# Patient Record
Sex: Female | Born: 1995 | Race: Black or African American | Hispanic: No | Marital: Single | Smoking: Current some day smoker
Health system: Southern US, Community
[De-identification: ages and names within clinical notes are randomized; demographics above are authoritative.]

## PROBLEM LIST (undated history)

## (undated) ENCOUNTER — Inpatient Hospital Stay (HOSPITAL_COMMUNITY): Payer: Self-pay

## (undated) DIAGNOSIS — Z789 Other specified health status: Secondary | ICD-10-CM

## (undated) HISTORY — PX: NO PAST SURGERIES: SHX2092

---
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
## 2016-01-13 ENCOUNTER — Emergency Department (HOSPITAL_COMMUNITY): Payer: Medicaid Other

## 2016-01-13 ENCOUNTER — Emergency Department (HOSPITAL_COMMUNITY)
Admission: EM | Admit: 2016-01-13 | Discharge: 2016-01-13 | Disposition: A | Discharge status: Home / Self Care | Payer: Medicaid Other | Attending: Emergency Medicine | Admitting: Emergency Medicine

## 2016-01-13 ENCOUNTER — Encounter (HOSPITAL_COMMUNITY): Payer: Self-pay | Admitting: Emergency Medicine

## 2016-01-13 DIAGNOSIS — Y990 Civilian activity done for income or pay: Secondary | ICD-10-CM | POA: Diagnosis not present

## 2016-01-13 DIAGNOSIS — S86912A Strain of unspecified muscle(s) and tendon(s) at lower leg level, left leg, initial encounter: Secondary | ICD-10-CM | POA: Insufficient documentation

## 2016-01-13 DIAGNOSIS — S8992XA Unspecified injury of left lower leg, initial encounter: Secondary | ICD-10-CM | POA: Diagnosis present

## 2016-01-13 DIAGNOSIS — W010XXA Fall on same level from slipping, tripping and stumbling without subsequent striking against object, initial encounter: Secondary | ICD-10-CM | POA: Diagnosis not present

## 2016-01-13 DIAGNOSIS — Y9289 Other specified places as the place of occurrence of the external cause: Secondary | ICD-10-CM | POA: Diagnosis not present

## 2016-01-13 DIAGNOSIS — Y9389 Activity, other specified: Secondary | ICD-10-CM | POA: Diagnosis not present

## 2016-01-13 MED ORDER — IBUPROFEN 800 MG PO TABS: 800.0000 mg | ORAL_TABLET | Freq: Three times a day (TID) | ORAL | Status: DC

## 2016-01-13 MED ORDER — TRAMADOL HCL 50 MG PO TABS
50.0000 mg | ORAL_TABLET | Freq: Two times a day (BID) | ORAL | Status: DC | PRN
Start: 2016-01-13 — End: 2017-06-25

## 2016-01-13 NOTE — Discharge Instructions (Signed)
Knee Sprain °A knee sprain is a tear in one of the strong, fibrous tissues that connect the bones (ligaments) in your knee. The severity of the sprain depends on how much of the ligament is torn. The tear can be either partial or complete. °CAUSES  °Often, sprains are a result of a fall or injury. The force of the impact causes the fibers of your ligament to stretch too much. This excess tension causes the fibers of your ligament to tear. °SIGNS AND SYMPTOMS  °You may have some loss of motion in your knee. Other symptoms include: °· Bruising. °· Pain in the knee area. °· Tenderness of the knee to the touch. °· Swelling. °DIAGNOSIS  °To diagnose a knee sprain, your health care provider will physically examine your knee. Your health care provider may also suggest an X-ray exam of your knee to make sure no bones are broken. °TREATMENT  °If your ligament is only partially torn, treatment usually involves keeping the knee in a fixed position (immobilization) or bracing your knee for activities that require movement for several weeks. To do this, your health care provider will apply a bandage, cast, or splint to keep your knee from moving and to support your knee during movement until it heals. For a partially torn ligament, the healing process usually takes 4-6 weeks. °If your ligament is completely torn, depending on which ligament it is, you may need surgery to reconnect the ligament to the bone or reconstruct it. After surgery, a cast or splint may be applied and will need to stay on your knee for 4-6 weeks while your ligament heals. °HOME CARE INSTRUCTIONS °· Keep your injured knee elevated to decrease swelling. °· To ease pain and swelling, apply ice to the injured area: °· Put ice in a plastic bag. °· Place a towel between your skin and the bag. °· Leave the ice on for 20 minutes, 2-3 times a day. °· Only take medicine for pain as directed by your health care provider. °· Do not leave your knee unprotected until  pain and stiffness go away (usually 4-6 weeks). °· If you have a cast or splint, do not allow it to get wet. If you have been instructed not to remove it, cover it with a plastic bag when you shower or bathe. Do not swim. °· Your health care provider may suggest exercises for you to do during your recovery to prevent or limit permanent weakness and stiffness. °SEEK IMMEDIATE MEDICAL CARE IF: °· Your cast or splint becomes damaged. °· Your pain becomes worse. °· You have significant pain, swelling, or numbness below the cast or splint. °MAKE SURE YOU: °· Understand these instructions. °· Will watch your condition. °· Will get help right away if you are not doing well or get worse. °  °This information is not intended to replace advice given to you by your health care provider. Make sure you discuss any questions you have with your health care provider. °  °Document Released: 09/15/2005 Document Revised: 10/06/2014 Document Reviewed: 04/27/2013 °Elsevier Interactive Patient Education ©2016 [employer] °RICE for Routine Care of Injuries °The routine care of many injuries includes rest, ice, compression, and elevation (RICE therapy). RICE therapy is often recommended for injuries to soft tissues, such as a muscle strain, ligament injuries, bruises, and overuse injuries. It can also be used for some bony injuries. Using RICE therapy can help to relieve pain, lessen swelling, and enable your body to heal. °Rest °Rest is required to allow your body to heal. This   usually involves reducing your normal activities and avoiding use of the injured part of your body. Generally, you can return to your normal activities when you are comfortable and have been given permission by your health care provider. Ice Icing your injury helps to keep the swelling down, and it lessens pain. Do not apply ice directly to your skin.  Put ice in a plastic bag.  Place a towel between your skin and the bag.  Leave the ice on for 20  minutes, 2-3 times a day. Do this for as long as you are directed by your health care provider. Compression Compression means putting pressure on the injured area. Compression helps to keep swelling down, gives support, and helps with discomfort. Compression may be done with an elastic bandage. If an elastic bandage has been applied, follow these general tips:  Remove and reapply the bandage every 3-4 hours or as directed by your health care provider.  Make sure the bandage is not wrapped too tightly, because this can cut off circulation. If part of your body beyond the bandage becomes blue, numb, cold, swollen, or more painful, your bandage is most likely too tight. If this occurs, remove your bandage and reapply it more loosely.  See your health care provider if the bandage seems to be making your problems worse rather than better. Elevation Elevation means keeping the injured area raised. This helps to lessen swelling and decrease pain. If possible, your injured area should be elevated at or above the level of your heart or the center of your chest. WHEN SHOULD I SEEK MEDICAL CARE? You should seek medical care if:  Your pain and swelling continue.  Your symptoms are getting worse rather than improving. These symptoms may indicate that further evaluation or further X-rays are needed. Sometimes, X-rays may not show a small broken bone (fracture) until a number of days later. Make a follow-up appointment with your health care provider. WHEN SHOULD I SEEK IMMEDIATE MEDICAL CARE? You should seek immediate medical care if:  You have sudden severe pain at or below the area of your injury.  You have redness or increased swelling around your injury.  You have tingling or numbness at or below the area of your injury that does not improve after you remove the elastic bandage.   This information is not intended to replace advice given to you by your health care provider. Make sure you discuss any  questions you have with your health care provider.   Document Released: 12/28/2000 Document Revised: 06/06/2015 Document Reviewed: 08/23/2014 Elsevier Interactive Patient Education 2016 Else[employer] Inc[employer] Patellar Dislocation A patellar dislocation occurs when your kneecap (patella) slips out of its normal position in a groove in front of the lower end of your thighbone (femur). This groove is called the patellofemoral groove.  CAUSES The kneecap is normally positioned over the front of the knee joint at the base of the thighbone. A kneecap can be dislocated when:  The kneecap is out of place (patellar tracking disorder), and force is applied.  The foot is firmly planted pointing outward, and the knee bends with the thigh turned inward. This kind of injury is common during many sports activities.  The inner edge of the kneecap is hit, pushing it toward the outer side of the leg. SIGNS AND SYMPTOMS  Severe pain.  A misshapen knee that looks like a bone is out of position.  A popping sensation, followed by a feeling that something is out of place.  Inability  to bend or straighten the knee.  Knee swelling.  Cool, pale skin or numbness and tingling in or below the affected knee. DIAGNOSIS  Your health care provider will physically examine the injured area. An X-ray exam may be done to make sure a bone fracture has not occurred. In some cases, your health care provider may look inside your knee joint with an instrument much like a pencil-sized telescope (arthroscope). This may be done to make sure you have no loose cartilage in your joint. Loose cartilage is not visible on an X-ray image. TREATMENT  In many instances, the patella can be guided back into position without much difficulty. It often goes back into position by straightening the leg. Often, nothing more may be needed other than a brief period of immobilization followed by the exercises your health care provider recommends. If patellar  dislocation starts to become frequent after the first incident, surgery may be needed to prevent your patella from slipping out of place. HOME CARE INSTRUCTIONS   Only take over-the-counter or prescription medicines for pain, discomfort, or fever as directed by your health care provider.  Use a knee brace if directed to do so by your health care provider.  Use crutches as instructed.  Apply ice to the injured knee:  Put ice in a plastic bag.  Place a towel between your skin and the bag.  Leave the ice on for 20 minutes, 2-3 times a day.  Follow your health care provider's instructions for doing any recommended range-of-motion exercises or other exercises. SEEK IMMEDIATE MEDICAL CARE IF:  You have increased pain or swelling in the knee that is not relieved with medicine.  You have increasing inflammation in the knee.  You have locking or catching of your knee. MAKE SURE YOU:  Understand these instructions.  Will watch your condition.  Will get help right away if you are not doing well or get worse.   This information is not intended to replace advice given to you by your health care provider. Make sure you discuss any questions you have with your health care provider.   Document Released: 06/10/2001 Document Revised: 07/06/2013 Document Reviewed: 04/27/2013 Elsevier Interactive Patient Education [employer]

## 2016-01-13 NOTE — ED Notes (Signed)
Attempted to triage, pt ambulatory on cell phone in room. RN informed family member at bedside that I would return when patient finished phone call.

## 2016-01-13 NOTE — ED Notes (Signed)
Patient reports she was at work, and the floor was slippery. She accidentally fell on her side, and her left knee "popped out". Able to moves left lower extremity on command, rates pain 7/10.

## 2016-01-13 NOTE — ED Provider Notes (Signed)
CSN: [id number]     Arrival date & time 01/13/16  2952 History   First MD Initiated Contact with Patient 01/13/16 0732     Chief Complaint  Patient presents with  . Knee Pain     (Consider location/radiation/quality/duration/timing/severity/associated sxs/prior Treatment) HPI   Patient is an 20 year old female who presents to the emergency room complaining of left knee pain that occurred just prior to arrival when she slipped while at work and felt her knee pop. 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  She denies any knee surgery, is not established with an orthopedic surgeon.  Her knee frequently "gives out" on her.  Currently she complains of 7/10 pain at rest, worsened to 9/10 with palpation and movement. She denies swelling, redness, current deformity. She states she is able to walk but she has a limp.  The patient denies any other injury during her slipped at work, did not hit her head, did not lose consciousness, no other complaints.  Past Medical History  Diagnosis Date  . Medical history non-contributory    Past Surgical History  Procedure Laterality Date  . No past surgeries     History reviewed. No pertinent family history. Social History  Substance Use Topics  . Smoking status: Never Smoker   . Smokeless tobacco: Never Used  . Alcohol Use: No   OB History    Gravida Para Term Preterm AB TAB SAB Ectopic Multiple Living   0 0 0 0 0 0 1     Review of Systems  All other systems reviewed and are negative.     Allergies  Review of patient's allergies indicates no known allergies.  Home Medications   Prior to Admission medications   Medication Sig Start Date End Date Taking? Authorizing Provider  levonorgestrel (MIRENA) 20 MCG/24HR IUD 1 each by Intrauterine route once.   Yes Historical Provider, MD  ibuprofen (ADVIL,MOTRIN) 800 MG tablet Take 1 tablet (800 mg  total) by mouth 3 (three) times daily. 01/13/16   Danelle Berry, PA-C  traMADol (ULTRAM) 50 MG tablet Take 1 tablet (50 mg total) by mouth every 12 (twelve) hours as needed for severe pain. 01/13/16   Danelle Berry, PA-C   BP 115/69 mmHg  Pulse 76  Temp(Src) 98.5 F (36.9 C) (Oral)  Resp 19  Ht  (1.854 m)  Wt 74.844 kg  BMI 21.77 kg/m2  SpO2 100%  LMP 12/13/2015 (Exact Date) Physical Exam  Constitutional: She is oriented to person, place, and time. She appears well-developed and well-nourished. No distress.  HENT:  Head: Normocephalic and atraumatic.  Right Ear: External ear normal.  Left Ear: External ear normal.  Nose: Nose normal.  Mouth/Throat: Oropharynx is clear and moist. No oropharyngeal exudate.  Eyes: Conjunctivae and EOM are normal. Pupils are equal, round, and reactive to light. Right eye exhibits no discharge. Left eye exhibits no discharge. No scleral icterus.  Neck: Normal range of motion. Neck supple. No JVD present. No tracheal deviation present.  Cardiovascular: Normal rate and regular rhythm.   Pulses:      Dorsalis pedis pulses are 2+ on the right side, and 2+ on the left side.  Pulmonary/Chest: Effort normal and breath sounds normal. No stridor. No respiratory distress.  Musculoskeletal: Normal range of motion. She exhibits tenderness. She exhibits no edema.       Left knee: She exhibits bony tenderness. She  exhibits normal range of motion, no swelling, no effusion, no ecchymosis, no deformity, no erythema, normal alignment, no LCL laxity, normal patellar mobility, normal meniscus and no MCL laxity. Tenderness found. Medial joint line, MCL and patellar tendon tenderness noted. No lateral joint line and no LCL tenderness noted.  Lymphadenopathy:    She has no cervical adenopathy.  Neurological: She is alert and oriented to person, place, and time. No sensory deficit. She exhibits normal muscle tone. Coordination normal.  Skin: Skin is warm and dry. No rash noted.  She is not diaphoretic. No erythema. No pallor.  Psychiatric: She has a normal mood and affect. Her behavior is normal. Judgment and thought content normal.  Nursing note and vitals reviewed.   ED Course  Procedures (including critical care time) Labs Review Labs Reviewed - No data to display  Imaging Review Dg Knee Complete 4 Views Left  01/13/2016  CLINICAL DATA:  Patellar dislocation while at work 2 hours ago. Generalized left knee pain. History of patellar dislocations. EXAM: LEFT KNEE - COMPLETE 4+ VIEW COMPARISON:  05/09/2011. FINDINGS: The previously demonstrated mild irregularity of the inferior pole of the patella is not currently demonstrated. There is a small, rounded ossicle adjacent to the inferior patella, laterally. No fracture, dislocation or effusion seen. IMPRESSION: No fracture or dislocation. Electronically Signed   By: Beckie SaltsSteven  Reid M.D.   On: 01/13/2016 08:23   I have personally reviewed and evaluated these images and lab results as part of my medical decision-making.   EKG Interpretation None      MDM   Pt presents with complaint of left patellar dislocation and pain secondary to slip at work immediately PTA.  She reports hx of frequent dislocations.  Patient has been able to ambulate in the ER without any difficulty, has been observed by multiple staff members and by myself walking without a limp and on her cell phone.  On exam she has no visible abnormality, but is ttp to patella, quadriceps tendon and to medial knee.  ROM, sensation, strength, and pulses are normal.  X-rays negative for fracture dislocation. Patient was placed in a knee sleeve, encouraged follow-up with Ortho.  As rice therapy, NSAIDs.  Work note provided. Patient was discharged in good condition with stable vital signs.  Filed Vitals:   01/13/16 0700 01/13/16 0843  BP: 115/69 110/61  Pulse: 76 73  Temp: 98.5 F (36.9 C)   Resp: 19 18    Final diagnoses:  Knee strain, left, initial  encounter        Danelle BerryLeisa Rozalynn Buege, PA-C 01/13/16 [id number]  Margarita Grizzleanielle Ray, MD 01/15/16 1615

## 2016-01-13 NOTE — ED Notes (Signed)
Pt verbalized that she had all belongings at d/c.  

## 2016-11-15 ENCOUNTER — Encounter (HOSPITAL_COMMUNITY): Payer: Self-pay

## 2016-11-15 ENCOUNTER — Emergency Department (HOSPITAL_COMMUNITY)
Admission: EM | Admit: 2016-11-15 | Discharge: 2016-11-15 | Disposition: A | Discharge status: Home / Self Care | Payer: Medicaid Other | Attending: Emergency Medicine | Admitting: Emergency Medicine

## 2016-11-15 DIAGNOSIS — Y999 Unspecified external cause status: Secondary | ICD-10-CM | POA: Diagnosis not present

## 2016-11-15 DIAGNOSIS — F17200 Nicotine dependence, unspecified, uncomplicated: Secondary | ICD-10-CM | POA: Insufficient documentation

## 2016-11-15 DIAGNOSIS — W458XXA Other foreign body or object entering through skin, initial encounter: Secondary | ICD-10-CM | POA: Diagnosis not present

## 2016-11-15 DIAGNOSIS — Y9389 Activity, other specified: Secondary | ICD-10-CM | POA: Insufficient documentation

## 2016-11-15 DIAGNOSIS — S60852A Superficial foreign body of left wrist, initial encounter: Secondary | ICD-10-CM | POA: Insufficient documentation

## 2016-11-15 DIAGNOSIS — Y9289 Other specified places as the place of occurrence of the external cause: Secondary | ICD-10-CM | POA: Insufficient documentation

## 2016-11-15 DIAGNOSIS — W278XXA Contact with other nonpowered hand tool, initial encounter: Secondary | ICD-10-CM

## 2016-11-15 MED ORDER — LIDOCAINE HCL (PF) 1 % IJ SOLN
5.0000 mL | Freq: Once | INTRAMUSCULAR | Status: AC
  Administered 2016-11-15: 5 mL
  Filled 2016-11-15: qty 5

## 2016-11-15 MED ORDER — BACITRACIN ZINC 500 UNIT/GM EX OINT
TOPICAL_OINTMENT | Freq: Two times a day (BID) | CUTANEOUS | Status: DC
  Administered 2016-11-15: 1 via TOPICAL

## 2016-11-15 NOTE — ED Provider Notes (Signed)
[facility]-EMER[facility]ENCY DEPT Provider Note   CSN: [id number] Arrival date & time: 11/15/16  1546  By signing my name below, I, [name], attest that this documentation has been prepared under the direction and in the presence of Kerrie BuffaloHope Viraj Liby, NP. Electronically Signed: [name], Scribe. 11/15/16. 4:57 PM.   History   Chief Complaint Chief Complaint  Patient presents with  . Arm Pain    HPI Jennifer Prince is a 21 y.o. female who presents to the Emergency Department complaining of acute onset, mild, intermittent left arm pain s/p getting wrist piercing stuck in hairnet at working earlier today. Piercing was placed in wrist 1 month ago. Pt has no associated symptoms. Pt does not know when last tetanus shot was.   The history is provided by the patient. No language interpreter was used.    Past Medical History:  Diagnosis Date  . Medical history non-contributory     Patient Active Problem List   Diagnosis Date Noted  . Vaginal delivery 11/18/2013  . Teen pregnancy 11/18/2013    Past Surgical History:  Procedure Laterality Date  . NO PAST SURGERIES      OB History    Gravida Para Term Preterm AB Living   1 1 1  0 0 1   SAB TAB Ectopic Multiple Live Births   0 0 0 0 1       Home Medications    Prior to Admission medications   Medication Sig Start Date End Date Taking? Authorizing Provider  ibuprofen (ADVIL,MOTRIN) 800 MG tablet Take 1 tablet (800 mg total) by mouth 3 (three) times daily. 01/13/16   Danelle BerryLeisa Tapia, PA-C  levonorgestrel (MIRENA) 20 MCG/24HR IUD 1 each by Intrauterine route once.    Historical Provider, MD  traMADol (ULTRAM) 50 MG tablet Take 1 tablet (50 mg total) by mouth every 12 (twelve) hours as needed for severe pain. 01/13/16   Danelle BerryLeisa Tapia, PA-C    Family History No family history on file.  Social History Social History  Substance Use Topics  . Smoking status: Current Some Day Smoker  . Smokeless tobacco: Current User  . Alcohol use No     Allergies    Patient has no known allergies.   Review of Systems Review of Systems  Constitutional: Negative for fever.  Skin: Positive for wound. Negative for color change.     Physical Exam Updated Vital Signs BP 116/74 (BP Location: Left Arm)   Pulse 93   Temp 98.9 F (37.2 C) (Oral)   Resp 16   Ht 6\' 1"  (1.854 m)   Wt 75.8 kg   LMP 11/13/2016   SpO2 99%   BMI 22.03 kg/m   Physical Exam  Constitutional: She is oriented to person, place, and time. She appears well-developed and well-nourished.  HENT:  Head: Normocephalic and atraumatic.  Cardiovascular: Normal rate.   Pulmonary/Chest: Effort normal.  Musculoskeletal:  Piercing to the dorsum of the left wrist. Due to injury slight tear in skin at site of piercing. Piercing was partially removed. No drainage, redness, streaking at site of piercing.  Neurological: She is alert and oriented to person, place, and time.  Skin: Skin is warm and dry.  Psychiatric: She has a normal mood and affect.  Nursing note and vitals reviewed.    ED Treatments / Results   DIAGNOSTIC STUDIES: Oxygen Saturation is 99% on room air, normal by my interpretation.    COORDINATION OF CARE: 4:28 PM Discussed treatment plan with pt at bedside and pt agreed  to plan, which includes removing the piercing from left wrist.  Radiology No results found.  Procedures .Foreign Body Removal Date/Time: 11/15/2016 4:50 PM Performed by: Janne Napoleon Authorized by: Arby Barrette  Consent: Verbal consent obtained. Risks and benefits: risks, benefits and alternatives were discussed Consent given by: patient Patient understanding: patient states understanding of the procedure being performed Patient consent: the patient's understanding of the procedure matches consent given Procedure consent: procedure consent matches procedure scheduled Site marked: the operative site was marked Body area: skin General location: upper extremity Location details: left  wrist Anesthesia: local infiltration  Anesthesia: Local Anesthetic: lidocaine 1% without epinephrine Anesthetic total (ml): 1 cc.  Sedation: Patient sedated: no Patient restrained: no Patient cooperative: yes Complexity: simple 1 objects recovered. Objects recovered: piercing Post-procedure assessment: foreign body removed Patient tolerance: Patient tolerated the procedure well with no immediate complications Comments: With curved hemostat able to remove piercing without difficulty. Irrigating with normal saline 10cc and again with 10cc. Location of piercing does not appear to be infected. Plan is for pt to [place] antibiotic ointment and to keep clean an covered.   (including critical care time)    Medications Ordered in ED Medications  bacitracin ointment (not administered)  lidocaine (PF) (XYLOCAINE) 1 % injection 5 mL (5 mLs Infiltration Given 11/15/16 1638)     Initial Impression / Assessment and Plan / ED Course  I have reviewed the triage vital signs and the nursing notes.  Final Clinical Impressions(s) / ED Diagnoses   Final diagnoses:  Body piercing as cause of accidental injury    New Prescriptions New Prescriptions   No medications on file   I personally performed the services described in this documentation, which was scribed in my presence. The recorded information has been reviewed and is accurate.     [address] [place], NP 11/15/16 1706    Arby Barrette, MD 11/19/16 [phone]

## 2016-11-15 NOTE — ED Triage Notes (Signed)
PT. STATED, I HAVE THIS PIERCING IN MY LEFT WRIST AREA. I had it put there a month ago.  Its just a piercing, and I need it taken out.

## 2016-11-15 NOTE — ED Notes (Signed)
Declined W/C at D/C and was escorted to lobby by RN. 

## 2016-12-21 ENCOUNTER — Emergency Department (HOSPITAL_COMMUNITY)
Admission: EM | Admit: 2016-12-21 | Discharge: 2016-12-21 | Disposition: A | Discharge status: Home / Self Care | Payer: Medicaid Other | Attending: Emergency Medicine | Admitting: Emergency Medicine

## 2016-12-21 ENCOUNTER — Encounter (HOSPITAL_COMMUNITY): Payer: Self-pay

## 2016-12-21 DIAGNOSIS — Z79899 Other long term (current) drug therapy: Secondary | ICD-10-CM | POA: Diagnosis not present

## 2016-12-21 DIAGNOSIS — X500XXA Overexertion from strenuous movement or load, initial encounter: Secondary | ICD-10-CM | POA: Insufficient documentation

## 2016-12-21 DIAGNOSIS — Y990 Civilian activity done for income or pay: Secondary | ICD-10-CM | POA: Insufficient documentation

## 2016-12-21 DIAGNOSIS — Y929 Unspecified place or not applicable: Secondary | ICD-10-CM | POA: Diagnosis not present

## 2016-12-21 DIAGNOSIS — M25512 Pain in left shoulder: Secondary | ICD-10-CM | POA: Diagnosis present

## 2016-12-21 DIAGNOSIS — Y939 Activity, unspecified: Secondary | ICD-10-CM | POA: Insufficient documentation

## 2016-12-21 DIAGNOSIS — F17200 Nicotine dependence, unspecified, uncomplicated: Secondary | ICD-10-CM | POA: Insufficient documentation

## 2016-12-21 MED ORDER — IBUPROFEN 600 MG PO TABS: 600.0000 mg | ORAL_TABLET | Freq: Four times a day (QID) | ORAL | 0 refills | Status: DC | PRN

## 2016-12-21 NOTE — ED Notes (Signed)
See edp assessment 

## 2016-12-21 NOTE — ED Provider Notes (Signed)
[facility] Provider Note   CSN: [id number] Arrival date & time: 12/21/16  1913   By signing my name below, I, Soijett Blue, attest that this documentation has been prepared under the direction and in the presence of Audry Pili, PA-C Electronically Signed: Soijett Blue, ED Scribe. 12/21/16. 7:32 PM.  History   Chief Complaint Chief Complaint  Patient presents with  . Back Pain    HPI Jennifer Prince is a 21 y.o. female who presents to the Emergency Department complaining of left shoulder pain onset this morning. Pt has tried ibuprofen and aleve with no relief of her symptoms. She states that she woke up with a stiff shoulder that is worsened with movement.She notes that she works at [employer] and completes heavy lifting while working. Pt denies back pain, color change, wound, recent trauma, recent injury, and any other symptoms.   The history is provided by the patient. No language interpreter was used.    Past Medical History:  Diagnosis Date  . Medical history non-contributory     Patient Active Problem List   Diagnosis Date Noted  . Vaginal delivery 11/18/2013  . Teen pregnancy 11/18/2013    Past Surgical History:  Procedure Laterality Date  . NO PAST SURGERIES      OB History    Gravida Para Term Preterm AB Living   1 1 1  0 0 1   SAB TAB Ectopic Multiple Live Births   0 0 0 0 1       Home Medications    Prior to Admission medications   Medication Sig Start Date End Date Taking? Authorizing Provider  ibuprofen (ADVIL,MOTRIN) 800 MG tablet Take 1 tablet (800 mg total) by mouth 3 (three) times daily. 01/13/16   Danelle Berry, PA-C  levonorgestrel (MIRENA) 20 MCG/24HR IUD 1 each by Intrauterine route once.    Historical Provider, MD  traMADol (ULTRAM) 50 MG tablet Take 1 tablet (50 mg total) by mouth every 12 (twelve) hours as needed for severe pain. 01/13/16   Danelle Berry, PA-C    Family History History reviewed. No pertinent family history.  Social  History Social History  Substance Use Topics  . Smoking status: Current Some Day Smoker  . Smokeless tobacco: Current User  . Alcohol use No     Allergies   Patient has no known allergies.   Review of Systems Review of Systems  Musculoskeletal: Positive for arthralgias (left shoulder). Negative for back pain.  Skin: Negative for color change and wound.     Physical Exam Updated Vital Signs BP 111/74 (BP Location: Left Arm)   Pulse 98   Temp 98.7 F (37.1 C) (Oral)   Resp 18   LMP 12/20/2016 (Exact Date)   SpO2 100%   Physical Exam  Constitutional: She is oriented to person, place, and time. She appears well-developed and well-nourished. No distress.  HENT:  Head: Normocephalic and atraumatic.  Eyes: EOM are normal.  Neck: Neck supple.  Cardiovascular: Normal rate.   Pulmonary/Chest: Effort normal. No respiratory distress.  Abdominal: She exhibits no distension.  Musculoskeletal: Normal range of motion.  Negative hawkins test, negative Neer's test. TTP anterior left shoulder. No pain with flexion/extension/abduction/adduction internal or external rotation. No obvious bony deformity.  Neurological: She is alert and oriented to person, place, and time.  Skin: Skin is warm and dry.  Psychiatric: She has a normal mood and affect. Her behavior is normal.  Nursing note and vitals reviewed.    ED Treatments / Results  DIAGNOSTIC  STUDIES: Oxygen Saturation is 100% on RA, nl by my interpretation.    COORDINATION OF CARE: 7:52 PM Discussed treatment plan with pt at bedside which includes UA and pt agreed to plan.   Labs (all labs ordered are listed, but only abnormal results are displayed) Labs Reviewed  POC URINE PREG, ED     Procedures Procedures (including critical care time)  Medications Ordered in ED Medications - No data to display   Initial Impression / Assessment and Plan / ED Course  I have reviewed the triage vital signs and the nursing  notes.  Pertinent labs & imaging results that were available during my care of the patient were reviewed by me and considered in my medical decision making (see chart for details).  I have reviewed and evaluated the relevant laboratory values. I have reviewed the relevant previous healthcare records. I obtained HPI from historian.  ED Course:  Assessment: Pt is a 21 y.o. female who presents with left shoulder pain. Occurred this AM. Worse with movement. Heavy lifting at work last night. No numbness/tingling. No back pain. On exam, pt in NAD. Nontoxic/nonseptic appearing. VSS. Afebrile. Likely rotator cuff tendonitis. Given sling. Plan is to DC home with follow up to PCP. At time of discharge, Patient is in no acute distress. Vital Signs are stable. Patient is able to ambulate. Patient able to tolerate PO.   Disposition/Plan:  DC home Additional Verbal discharge instructions given and discussed with patient.  Pt Instructed to f/u with PCP in the next week for evaluation and treatment of symptoms. Return precautions given Pt acknowledges and agrees with plan  Supervising Physician Shaune Pollackameron Isaacs, MD  Final Clinical Impressions(s) / ED Diagnoses   Final diagnoses:  Acute pain of left shoulder    New Prescriptions New Prescriptions   No medications on file   I personally performed the services described in this documentation, which was scribed in my presence. The recorded information has been reviewed and is accurate.    Audry Piliyler Rhyland Hinderliter, PA-C 12/21/16 1956    Shaune Pollackameron Isaacs, MD 12/22/16 2049

## 2016-12-21 NOTE — Discharge Instructions (Signed)
Please read and follow all provided instructions.  Your diagnoses today include:  1. Acute pain of left shoulder     Tests performed today include: Vital signs. See below for your results today.   Medications prescribed:  Take as prescribed   Home care instructions:  Follow any educational materials contained in this packet.  Follow-up instructions: Please follow-up with your primary care provider for further evaluation of symptoms and treatment   Return instructions:  Please return to the Emergency Department if you do not get better, if you get worse, or new symptoms OR  - Fever (temperature greater than 101.55F)  - Bleeding that does not stop with holding pressure to the area    -Severe pain (please note that you may be more sore the day after your accident)  - Chest Pain  - Difficulty breathing  - Severe nausea or vomiting  - Inability to tolerate food and liquids  - Passing out  - Skin becoming red around your wounds  - Change in mental status (confusion or lethargy)  - New numbness or weakness    Please return if you have any other emergent concerns.  Additional Information:  Your vital signs today were: BP 111/74 (BP Location: Left Arm)    Pulse 98    Temp 98.7 F (37.1 C) (Oral)    Resp 18    LMP 12/20/2016 (Exact Date)    SpO2 100%  If your blood pressure (BP) was elevated above 135/85 this visit, please have this repeated by your doctor within one month. ---------------

## 2016-12-21 NOTE — ED Triage Notes (Signed)
Pt complaining of L shoulder and back pain. Pt denies any injury/trauma.  Pt denies states believes related to recent work.

## 2017-03-27 DIAGNOSIS — R070 Pain in throat: Secondary | ICD-10-CM | POA: Diagnosis present

## 2017-03-27 DIAGNOSIS — Z79899 Other long term (current) drug therapy: Secondary | ICD-10-CM | POA: Diagnosis not present

## 2017-03-27 DIAGNOSIS — R6883 Chills (without fever): Secondary | ICD-10-CM | POA: Insufficient documentation

## 2017-03-27 DIAGNOSIS — R110 Nausea: Secondary | ICD-10-CM | POA: Insufficient documentation

## 2017-03-27 DIAGNOSIS — R05 Cough: Secondary | ICD-10-CM | POA: Diagnosis not present

## 2017-03-27 DIAGNOSIS — F17200 Nicotine dependence, unspecified, uncomplicated: Secondary | ICD-10-CM | POA: Diagnosis not present

## 2017-03-27 DIAGNOSIS — Z5321 Procedure and treatment not carried out due to patient leaving prior to being seen by health care provider: Secondary | ICD-10-CM | POA: Diagnosis not present

## 2017-03-28 ENCOUNTER — Emergency Department (HOSPITAL_COMMUNITY): Payer: Medicaid Other

## 2017-03-28 ENCOUNTER — Encounter (HOSPITAL_COMMUNITY): Payer: Self-pay

## 2017-03-28 ENCOUNTER — Emergency Department (HOSPITAL_COMMUNITY)
Admission: EM | Admit: 2017-03-28 | Discharge: 2017-03-28 | Disposition: A | Discharge status: Home / Self Care | Payer: Medicaid Other | Attending: Emergency Medicine | Admitting: Emergency Medicine

## 2017-03-28 LAB — RAPID STREP SCREEN (MED CTR MEBANE ONLY): Streptococcus, Group A Screen (Direct): NEGATIVE

## 2017-03-28 NOTE — ED Notes (Signed)
Pt c/o throat pain, chills, body aches, coughing, some nausea.

## 2017-03-30 LAB — CULTURE, GROUP A STREP (THRC)

## 2017-05-27 ENCOUNTER — Encounter (HOSPITAL_COMMUNITY): Payer: Self-pay | Admitting: *Deleted

## 2017-05-27 ENCOUNTER — Inpatient Hospital Stay (HOSPITAL_COMMUNITY)
Admission: AD | Admit: 2017-05-27 | Discharge: 2017-05-27 | Discharge status: Against Medical Advice | Payer: Medicaid Other | Source: Ambulatory Visit | Attending: Family Medicine | Admitting: Family Medicine

## 2017-05-27 DIAGNOSIS — N941 Unspecified dyspareunia: Secondary | ICD-10-CM | POA: Insufficient documentation

## 2017-05-27 DIAGNOSIS — N939 Abnormal uterine and vaginal bleeding, unspecified: Secondary | ICD-10-CM | POA: Insufficient documentation

## 2017-05-27 DIAGNOSIS — Z30432 Encounter for removal of intrauterine contraceptive device: Secondary | ICD-10-CM | POA: Insufficient documentation

## 2017-05-27 LAB — URINALYSIS, ROUTINE W REFLEX MICROSCOPIC
Bilirubin Urine: NEGATIVE
Glucose, UA: NEGATIVE mg/dL
KETONES UR: 5 mg/dL — AB
Nitrite: NEGATIVE
Protein, ur: 30 mg/dL — AB
Specific Gravity, Urine: 1.024 (ref 1.005–1.030)
pH: 6 (ref 5.0–8.0)

## 2017-05-27 LAB — WET PREP, GENITAL
Trich, Wet Prep: NONE SEEN
Yeast Wet Prep HPF POC: NONE SEEN

## 2017-05-27 LAB — POCT PREGNANCY, URINE: PREG TEST UR: NEGATIVE

## 2017-05-27 NOTE — MAU Provider Note (Signed)
HPI:   [name] is a 21 y.o. female presenting with the complaints of vaginal bleeding and cramping due to her IUD. Pt states that it hurts her when she lays supine, during urination, and during intercourse with penetration. Pt had the IUD place at [employer]. Symptoms have been for a couple weeks with the bleeding happening for a week.  Marland Kitchen.Review of Systems  Constitutional: Negative for chills, fever and malaise/fatigue.  Gastrointestinal: Positive for abdominal pain. Negative for constipation, diarrhea, nausea and vomiting.  Genitourinary: Positive for dysuria, frequency and hematuria. Negative for urgency.       Pt states she does not discharge in her clothing. Just notices blood while urinating    .Marland KitchenPhysical Exam  Constitutional: She is oriented to person, place, and time and well-developed, well-nourished, and in no distress. Vital signs are normal.  HENT:  Head: Normocephalic and atraumatic.  Cardiovascular: Normal rate, regular rhythm, S1 normal and S2 normal.   Pulmonary/Chest: Effort normal and breath sounds normal. No accessory muscle usage. No respiratory distress.  Genitourinary:  Genitourinary Comments: Vagina: small amount of white mucoid discharge. No odor.  Cervix: Non-reddened. IUD noticed mal positioned on exam. Removed using ring forceps without difficulty. Patient tolerated procedure well. Chaperone: Primary RN charperoned pelvic exam   Neurological: She is alert and oriented to person, place, and time.  Skin: Skin is warm, dry and intact.   Patient Vitals for the past 24 hrs:  BP Temp Temp src Pulse Resp Height Weight  05/27/17 1634 112/65 97.7 F (36.5 C) Oral 91 16 6\' 3"  (1.905 m) 144 lb (65.3 kg)   Results for orders placed or performed during the hospital encounter of 05/27/17 (from the past 48 hour(s))  Urinalysis, Routine w reflex microscopic     Status: Abnormal   Collection Time: 05/27/17  4:38 PM  Result Value Ref Range    Color, Urine YELLOW YELLOW   APPearance HAZY (A) CLEAR   Specific Gravity, Urine 1.024 1.005 - 1.030   pH 6.0 5.0 - 8.0   Glucose, UA NEGATIVE NEGATIVE mg/dL   Hgb urine dipstick SMALL (A) NEGATIVE   Bilirubin Urine NEGATIVE NEGATIVE   Ketones, ur 5 (A) NEGATIVE mg/dL   Protein, ur 30 (A) NEGATIVE mg/dL   Nitrite NEGATIVE NEGATIVE   Leukocytes, UA MODERATE (A) NEGATIVE   RBC / HPF 6-30 0 - 5 RBC/hpf   WBC, UA TOO NUMEROUS TO COUNT 0 - 5 WBC/hpf   Bacteria, UA RARE (A) NONE SEEN   Squamous Epithelial / LPF 0-5 (A) NONE SEEN   Mucus PRESENT    Non Squamous Epithelial 0-5 (A) NONE SEEN  Pregnancy, urine POC     Status: None   Collection Time: 05/27/17  5:08 PM  Result Value Ref Range   Preg Test, Ur NEGATIVE NEGATIVE    Comment:        THE SENSITIVITY OF THIS METHODOLOGY IS >24 mIU/mL   Wet prep, genital     Status: Abnormal   Collection Time: 05/27/17  5:30 PM  Result Value Ref Range   Yeast Wet Prep HPF POC NONE SEEN NONE SEEN   Trich, Wet Prep NONE SEEN NONE SEEN   Clue Cells Wet Prep HPF POC PRESENT (A) NONE SEEN   WBC, Wet Prep HPF POC MODERATE (A) NONE SEEN    Comment: MODERATE BACTERIA SEEN   Sperm PRESENT        A:  1. Encounter for IUD removal   2. Dyspareunia, female  P:  Patient left prior to results discussed or paper work given.   Prince confirm that Prince have verified the information documented in the nurse practitioner's Prince note and that Prince have also personally reperformed the physical exam and all medical decision making activities.  Jennifer Prince    Jennifer Prince, Jennifer Rundquist I, NP 05/27/2017 7:38 PM

## 2017-05-27 NOTE — MAU Note (Signed)
Pt has had IUD for past 3 years, is painful during urination & intercourse, has cramps all the time.   Has spotting @ times for the last 3 weeks.  Was inserted by [facility].

## 2017-05-27 NOTE — Discharge Instructions (Signed)
Dyspareunia, Female Dyspareunia is pain that is associated with sexual activity. This can affect any part of the genitals or lower abdomen, and there are many possible causes. This condition ranges from mild to severe. Depending on the cause, dyspareunia may get better with treatment, or it may return (recur) over time. What are the causes? The cause of this condition is not always known. Possible causes include:  Cancer.  Psychological factors, such as depression, anxiety, or previous traumatic experiences.  Severe pain and tenderness of the skin around the vagina (vulva) when it is touched (vulvar vestibulitis syndrome).  Infection of the pelvis or the vulva.  Infection of the vagina.  Painful, involuntary tightening (contraction) of the vaginal muscles when anything is put inside the vagina (vaginismus).  Allergic reaction.  Ovarian cysts.  Solid growths of tissue (tumors) in the ovaries or the uterus.  Scar tissue in the ovaries, vagina, or pelvis.  Vaginal dryness.  Thinning of the tissue (atrophy) of the vulva and vagina.  Skin conditions that affect the vulva (vulvar dermatoses), such as lichen sclerosus or lichen planus.  Endometriosis.  Tubal pregnancy.  A tilted uterus.  Uterine prolapse.  Adhesions in the vagina.  Bladder problems.  Intestinal problems.  Certain medicines.  Medical conditions such as diabetes, arthritis, or thyroid disease.  What increases the risk? The following factors may make you more likely to develop this condition:  Having experienced physical or sexual trauma.  Having given birth more than once.  Taking birth control pills.  Having gone through menopause.  Having recently given birth, typically within the past 3-6 months.  Breastfeeding.  What are the signs or symptoms? The main symptom of this condition is pain in any part of the genitals or lower abdomen during or after sexual activity. This may include pain  during sexual arousal, genital stimulation, or orgasm. Pain may get worse when anything is inserted into the vagina, or when the genitals are touched in any way, such as when sitting or wearing pants. Pain can range from mild to severe, depending on the cause of the condition. In some cases, symptoms go away with treatment and return (recur) at a later date. How is this diagnosed? This condition may be diagnosed based on:  Your symptoms, including: ? Where your pain is located. ? When your pain occurs.  Your medical history.  A physical exam. This may include a pelvic exam and a Pap test. This is a screening test that is used to check for signs of cancer of the vagina, cervix, and uterus.  Tests, including: ? Blood tests. ? Ultrasound. This uses sound waves to make a picture of the area that is being tested. ? Urine culture. This test involves checking a urine sample for signs of infection. ? Culture test. This is when your health care provider uses a swab to collect a sample of vaginal fluid. The sample is checked for signs of infection. ? X-rays. ? MRI. ? CT scan. ? Laparoscopy. This is a procedure in which a small incision is made in your lower abdomen and a lighted, pencil-sized instrument (laparoscope) is passed through the incision and used to look inside your pelvis.  You may be referred to a health care provider who specializes in women's health (gynecologist). In some cases, diagnosing the cause of dyspareunia can be difficult. How is this treated? Treatment depends on the cause of your condition and your symptoms. In most cases, you may need to stop sexual activity until your symptoms  improve. Treatment may include:  Lubricants.  Kegel exercises or vaginal dilators.  Medicated skin creams.  Medicated vaginal creams.  Hormonal therapy.  Antibiotic medicine to prevent or fight infection.  Medicines that help to relieve pain.  Medicines that treat depression  (antidepressants).  Psychological counseling.  Sex therapy.  Surgery.  Follow these instructions at home: Lifestyle  Avoid tight clothing and irritating materials around your genital and abdominal area.  Use water-based lubricants as needed. Avoid oil-based lubricants.  Do not use any products that irritate you. This may include certain condoms, spermicides, lubricants, soaps, tampons, vaginal sprays, or douches.  Always practice safe sex. Talk with your health care provider about which form of birth control (contraception) is best for you.  Maintain open communication with your sexual partner. General instructions  Take over-the-counter and prescription medicines only as told by your health care provider.  If you had tests done, it is your responsibility to get your tests results. Ask your health care provider or the department performing the test when your results will be ready.  Urinate before you engage in sexual activity.  Consider joining a support group.  Keep all follow-up visits as told by your health care provider. This is important. Contact a health care provider if:  You develop vaginal bleeding after sexual intercourse.  You develop a lump at the opening of your vagina. Seek medical care even if the lump is painless.  You have: ? Abnormal vaginal discharge. ? Vaginal dryness. ? Itchiness or irritation of your vulva or vagina. ? A new rash. ? Symptoms that get worse or do not improve with treatment. ? A fever. ? Pain when you urinate. ? Blood in your urine. Get help right away if:  You develop severe pain in your abdomen during or shortly after sexual intercourse.  You pass out after having sexual intercourse. This information is not intended to replace advice given to you by your health care provider. Make sure you discuss any questions you have with your health care provider. Document Released: 10/05/2007 Document Revised: 01/25/2016 Document  Reviewed: 04/17/2015 Elsevier Interactive Patient Education  2018 [employer].   Intrauterine Device Information An intrauterine device (IUD) is inserted into your uterus to prevent pregnancy. There are two types of IUDs available:  Copper IUD--This type of IUD is wrapped in copper wire and is placed inside the uterus. Copper makes the uterus and fallopian tubes produce a fluid that kills sperm. The copper IUD can stay in place for 10 years.  Hormone IUD--This type of IUD contains the hormone progestin (synthetic progesterone). The hormone thickens the cervical mucus and prevents sperm from entering the uterus. It also thins the uterine lining to prevent implantation of a fertilized egg. The hormone can weaken or kill the sperm that get into the uterus. One type of hormone IUD can stay in place for 5 years, and another type can stay in place for 3 years.  Your health care provider will make sure you are a good candidate for a contraceptive IUD. Discuss with your health care provider the possible side effects. Advantages of an intrauterine device  IUDs are highly effective, reversible, long acting, and low maintenance.  There are no estrogen-related side effects.  An IUD can be used when breastfeeding.  IUDs are not associated with weight gain.  The copper IUD works immediately after insertion.  The hormone IUD works right away if inserted within 7 days of your period starting. You will need to use a backup  method of birth control for 7 days if the hormone IUD is inserted at any other time in your cycle.  The copper IUD does not interfere with your female hormones.  The hormone IUD can make heavy menstrual periods lighter and decrease cramping.  The hormone IUD can be used for 3 or 5 years.  The copper IUD can be used for 10 years. Disadvantages of an intrauterine device  The hormone IUD can be associated with irregular bleeding patterns.  The copper IUD can make your menstrual  flow heavier and more painful.  You may experience cramping and vaginal bleeding after insertion. This information is not intended to replace advice given to you by your health care provider. Make sure you discuss any questions you have with your health care provider. Document Released: 08/19/2004 Document Revised: 02/21/2016 Document Reviewed: 03/06/2013 Elsevier Interactive Patient Education  2017 [employer].

## 2017-05-28 LAB — GC/CHLAMYDIA PROBE AMP (~~LOC~~) NOT AT ARMC
CHLAMYDIA, DNA PROBE: NEGATIVE
Neisseria Gonorrhea: POSITIVE — AB

## 2017-06-25 ENCOUNTER — Encounter (HOSPITAL_COMMUNITY): Payer: Self-pay | Admitting: *Deleted

## 2017-06-25 ENCOUNTER — Inpatient Hospital Stay (HOSPITAL_COMMUNITY): Payer: Self-pay

## 2017-06-25 ENCOUNTER — Inpatient Hospital Stay (HOSPITAL_COMMUNITY)
Admission: AD | Admit: 2017-06-25 | Discharge: 2017-06-25 | Disposition: A | Discharge status: Home / Self Care | Payer: Self-pay | Source: Ambulatory Visit | Attending: Obstetrics & Gynecology | Admitting: Obstetrics & Gynecology

## 2017-06-25 DIAGNOSIS — O23591 Infection of other part of genital tract in pregnancy, first trimester: Secondary | ICD-10-CM | POA: Insufficient documentation

## 2017-06-25 DIAGNOSIS — Z6790 Unspecified blood type, Rh positive: Secondary | ICD-10-CM

## 2017-06-25 DIAGNOSIS — O99331 Smoking (tobacco) complicating pregnancy, first trimester: Secondary | ICD-10-CM | POA: Insufficient documentation

## 2017-06-25 DIAGNOSIS — N760 Acute vaginitis: Secondary | ICD-10-CM

## 2017-06-25 DIAGNOSIS — Z3A01 Less than 8 weeks gestation of pregnancy: Secondary | ICD-10-CM | POA: Insufficient documentation

## 2017-06-25 DIAGNOSIS — O3680X0 Pregnancy with inconclusive fetal viability, not applicable or unspecified: Secondary | ICD-10-CM

## 2017-06-25 DIAGNOSIS — F17200 Nicotine dependence, unspecified, uncomplicated: Secondary | ICD-10-CM | POA: Insufficient documentation

## 2017-06-25 DIAGNOSIS — O208 Other hemorrhage in early pregnancy: Secondary | ICD-10-CM

## 2017-06-25 DIAGNOSIS — B9689 Other specified bacterial agents as the cause of diseases classified elsewhere: Secondary | ICD-10-CM | POA: Insufficient documentation

## 2017-06-25 DIAGNOSIS — O209 Hemorrhage in early pregnancy, unspecified: Secondary | ICD-10-CM | POA: Insufficient documentation

## 2017-06-25 LAB — CBC
HEMATOCRIT: 37.2 % (ref 36.0–46.0)
HEMOGLOBIN: 12.7 g/dL (ref 12.0–15.0)
MCH: 30.9 pg (ref 26.0–34.0)
MCHC: 34.1 g/dL (ref 30.0–36.0)
MCV: 90.5 fL (ref 78.0–100.0)
Platelets: 325 10*3/uL (ref 150–400)
RBC: 4.11 MIL/uL (ref 3.87–5.11)
RDW: 14.9 % (ref 11.5–15.5)
WBC: 8.6 10*3/uL (ref 4.0–10.5)

## 2017-06-25 LAB — POCT PREGNANCY, URINE: PREG TEST UR: POSITIVE — AB

## 2017-06-25 LAB — URINALYSIS, ROUTINE W REFLEX MICROSCOPIC
Bacteria, UA: NONE SEEN
Bilirubin Urine: NEGATIVE
GLUCOSE, UA: NEGATIVE mg/dL
HGB URINE DIPSTICK: NEGATIVE
Ketones, ur: NEGATIVE mg/dL
NITRITE: NEGATIVE
PH: 5 (ref 5.0–8.0)
Protein, ur: NEGATIVE mg/dL
SPECIFIC GRAVITY, URINE: 1.025 (ref 1.005–1.030)

## 2017-06-25 LAB — WET PREP, GENITAL
SPERM: NONE SEEN
Yeast Wet Prep HPF POC: NONE SEEN

## 2017-06-25 LAB — HCG, QUANTITATIVE, PREGNANCY: hCG, Beta Chain, Quant, S: 2482 m[IU]/mL — ABNORMAL HIGH (ref <5)

## 2017-06-25 MED ORDER — METRONIDAZOLE 500 MG PO TABS: 500.0000 mg | ORAL_TABLET | Freq: Two times a day (BID) | ORAL | 0 refills | Status: DC

## 2017-06-25 NOTE — MAU Provider Note (Signed)
History     CSN: [id number]  Arrival date and time: 06/25/17 1535   Chief Complaint  Patient presents with  . Possible Pregnancy  . Vaginal Bleeding   G2P1001  by LMP here with VB. Bleeding started 3 days ago. Was heavy as a period the first day, lighter the second day and gone today. Some mild cramping is present. Has not taken anything for it. No vaginal discharge. No urinary sx.     Past Medical History:  Diagnosis Date  . Medical history non-contributory     Past Surgical History:  Procedure Laterality Date  . NO PAST SURGERIES      No family history on file.  Social History  Substance Use Topics  . Smoking status: Current Some Day Smoker  . Smokeless tobacco: Current User  . Alcohol use No    Allergies: No Known Allergies  Prescriptions Prior to Admission  Medication Sig Dispense Refill Last Dose  . ibuprofen (ADVIL,MOTRIN) 600 MG tablet Take 1 tablet (600 mg total) by mouth every 6 (six) hours as needed. 30 tablet 0   . traMADol (ULTRAM) 50 MG tablet Take 1 tablet (50 mg total) by mouth every 12 (twelve) hours as needed for severe pain. 10 tablet 0     Review of Systems  Gastrointestinal: Positive for abdominal pain.  Genitourinary: Positive for vaginal bleeding. Negative for dysuria, frequency, urgency and vaginal discharge.   Physical Exam   Blood pressure 107/61, pulse 75, temperature 98.9 F (37.2 C), temperature source Oral, resp. rate 15, height  (1.88 m), weight 145 lb (65.8 kg), last menstrual period 05/16/2017, SpO2 100 %, unknown if currently breastfeeding.  Physical Exam  Constitutional: She is oriented to person, place, and time. She appears well-developed and well-nourished. No distress.  HENT:  Head: Normocephalic and atraumatic.  Neck: Normal range of motion.  Cardiovascular: Normal rate.   Respiratory: Effort normal.  GI: Soft. She exhibits no distension and no mass. There is no tenderness. There is no rebound and no  guarding.  Genitourinary:  Genitourinary Comments: External: no lesions or erythema Vagina: rugated, pink, moist, moderate milky malodorous discharge, NO blood Uterus: non enlarged, anteverted, non tender, no CMT Adnexae: no masses, no tenderness left, no tenderness right   Musculoskeletal: Normal range of motion.  Neurological: She is alert and oriented to person, place, and time.  Skin: Skin is warm and dry.  Psychiatric: She has a normal mood and affect.   Results for orders placed or performed during the hospital encounter of 06/25/17 (from the past 24 hour(s))  Urinalysis, Routine w reflex microscopic     Status: Abnormal   Collection Time: 06/25/17  4:20 PM  Result Value Ref Range   Color, Urine YELLOW YELLOW   APPearance CLEAR CLEAR   Specific Gravity, Urine 1.025 1.005 - 1.030   pH 5.0 5.0 - 8.0   Glucose, UA NEGATIVE NEGATIVE mg/dL   Hgb urine dipstick NEGATIVE NEGATIVE   Bilirubin Urine NEGATIVE NEGATIVE   Ketones, ur NEGATIVE NEGATIVE mg/dL   Protein, ur NEGATIVE NEGATIVE mg/dL   Nitrite NEGATIVE NEGATIVE   Leukocytes, UA SMALL (A) NEGATIVE   RBC / HPF 0-5 0 - 5 RBC/hpf   WBC, UA 6-30 0 - 5 WBC/hpf   Bacteria, UA NONE SEEN NONE SEEN   Squamous Epithelial / LPF 0-5 (A) NONE SEEN   Mucus PRESENT   hCG, quantitative, pregnancy     Status: Abnormal   Collection Time: 06/25/17  5:56 PM  Result Value  Ref Range   hCG, Beta Chain, Quant, S 2,482 (H) <5 mIU/mL  CBC     Status: None   Collection Time: 06/25/17  5:56 PM  Result Value Ref Range   WBC 8.6 4.0 - 10.5 K/uL   RBC 4.11 3.87 - 5.11 MIL/uL   Hemoglobin 12.7 12.0 - 15.0 g/dL   HCT 40.9 81.1 - 91.4 %   MCV 90.5 78.0 - 100.0 fL   MCH 30.9 26.0 - 34.0 pg   MCHC 34.1 30.0 - 36.0 g/dL   RDW 78.2 95.6 - 21.3 %   Platelets 325 150 - 400 K/uL  Pregnancy, urine POC     Status: Abnormal   Collection Time: 06/25/17  6:00 PM  Result Value Ref Range   Preg Test, Ur POSITIVE (A) NEGATIVE   US Ob Comp Less 14  Wks  Result Date: 06/25/2017 CLINICAL DATA:  Vaginal bleeding. Estimated gestational age per LMP is 5 weeks 5 days with quantitative beta HCG 2,482. EXAM: OBSTETRIC <14 WK [place] AND TRANSVAGINAL OB US TECHNIQUE: Both transabdominal and transvaginal ultrasound examinations were performed for complete evaluation of the gestation as well as the maternal uterus, adnexal regions, and pelvic cul-de-sac. Transvaginal technique was performed to assess early pregnancy. COMPARISON:  None. FINDINGS: Intrauterine gestational sac: Single visualized. Yolk sac:  Not visualized. Embryo:  Not visualized. Cardiac Activity: Not visualized. Heart Rate: Not visualized.  Bpm MSD: 4.4  mm   5 w   1  d Subchorionic hemorrhage:  None visualized. Maternal uterus/adnexae: Ovaries normal in size, shape and position. No free pelvic fluid. IMPRESSION: Intrauterine gestational sac without yolk sac or embryo. Mean sac diameter compatible with estimated gestational age 872 weeks 1 day. Findings likely represents a normal early pregnancy. Recommend follow-up serial quantitative beta HCG and follow-up ultrasound 2 weeks. Electronically Signed   By: Elberta Fortis M.D.   On: 06/25/2017 19:40   US Ob Transvaginal  Result Date: 06/25/2017 CLINICAL DATA:  Vaginal bleeding. Estimated gestational age per LMP is 5 weeks 5 days with quantitative beta HCG 2,482. EXAM: OBSTETRIC <14 WK [place] AND TRANSVAGINAL OB US TECHNIQUE: Both transabdominal and transvaginal ultrasound examinations were performed for complete evaluation of the gestation as well as the maternal uterus, adnexal regions, and pelvic cul-de-sac. Transvaginal technique was performed to assess early pregnancy. COMPARISON:  None. FINDINGS: Intrauterine gestational sac: Single visualized. Yolk sac:  Not visualized. Embryo:  Not visualized. Cardiac Activity: Not visualized. Heart Rate: Not visualized.  Bpm MSD: 4.4  mm   5 w   1  d Subchorionic hemorrhage:  None visualized. Maternal uterus/adnexae:  Ovaries normal in size, shape and position. No free pelvic fluid. IMPRESSION: Intrauterine gestational sac without yolk sac or embryo. Mean sac diameter compatible with estimated gestational age 872 weeks 1 day. Findings likely represents a normal early pregnancy. Recommend follow-up serial quantitative beta HCG and follow-up ultrasound 2 weeks. Electronically Signed   By: Elberta Fortis M.D.   On: 06/25/2017 19:40   MAU Course  Procedures  MDM Labs and [place] ordered and reviewed. US shows IUGS but no YS or FP therefore cannot r/o early pregnancy or ectopic. Will follow qHCG and treat BV. Stable for discharge home.   Assessment and Plan   1. Pregnancy of unknown anatomic location   2. Vaginal bleeding affecting early pregnancy   3. Blood type, Rh positive   4. Bacterial vaginosis    Discharge home Follow up in MAU on 06/28/17 am for [facility] Ectopic/return precautions Rx Flagyl  Allergies as of 06/25/2017   No Known Allergies     Medication List    STOP taking these medications   ibuprofen 600 MG tablet Commonly known as:  ADVIL,MOTRIN   traMADol 50 MG tablet Commonly known as:  ULTRAM     TAKE these medications   metroNIDAZOLE 500 MG tablet Commonly known as:  FLAGYL Take 1 tablet (500 mg total) by mouth 2 (two) times daily.            Discharge Care Instructions        Start     Ordered   06/25/17 0000  Discharge patient    Question Answer Comment  Discharge disposition 01-Home or Self Care   Discharge patient date 06/25/2017      06/25/17 2035   06/25/17 0000  metroNIDAZOLE (FLAGYL) 500 MG tablet  2 times daily    Question:  Supervising Provider  Answer:  Tereso Newcomer   06/25/17 2036     Donette Larry, CNM 06/25/2017, 8:48 PM

## 2017-06-25 NOTE — Discharge Instructions (Signed)
Bacterial Vaginosis Bacterial vaginosis is an infection of the vagina. It happens when too many germs (bacteria) grow in the vagina. This infection puts you at risk for infections from sex (STIs). Treating this infection can lower your risk for some STIs. You should also treat this if you are pregnant. It can cause your baby to be born early. Follow these instructions at home: Medicines  Take over-the-counter and prescription medicines only as told by your doctor.  Take or use your antibiotic medicine as told by your doctor. Do not stop taking or using it even if you start to feel better. General instructions  If you your sexual partner is a woman, tell her that you have this infection. She needs to get treatment if she has symptoms. If you have a female partner, he does not need to be treated.  During treatment: ? Avoid sex. ? Do not douche. ? Avoid alcohol as told. ? Avoid breastfeeding as told.  Drink enough fluid to keep your pee (urine) clear or pale yellow.  Keep your vagina and butt (rectum) clean. ? Wash the area with warm water every day. ? Wipe from front to back after you use the toilet.  Keep all follow-up visits as told by your doctor. This is important. Preventing this condition  Do not douche.  Use only warm water to wash around your vagina.  Use protection when you have sex. This includes: ? Latex condoms. ? Dental dams.  Limit how many people you have sex with. It is best to only have sex with the same person (be monogamous).  Get tested for STIs. Have your partner get tested.  Wear underwear that is cotton or lined with cotton.  Avoid tight pants and pantyhose. This is most important in summer.  Do not use any products that have nicotine or tobacco in them. These include cigarettes and e-cigarettes. If you need help quitting, ask your doctor.  Do not use illegal drugs.  Limit how much alcohol you drink. Contact a doctor if:  Your symptoms do not get  better, even after you are treated.  You have more discharge or pain when you pee (urinate).  You have a fever.  You have pain in your belly (abdomen).  You have pain with sex.  Your bleed from your vagina between periods. Summary  This infection happens when too many germs (bacteria) grow in the vagina.  Treating this condition can lower your risk for some infections from sex (STIs).  You should also treat this if you are pregnant. It can cause early (premature) birth.  Do not stop taking or using your antibiotic medicine even if you start to feel better. This information is not intended to replace advice given to you by your health care provider. Make sure you discuss any questions you have with your health care provider. Document Released: 06/24/2008 Document Revised: 05/31/2016 Document Reviewed: 05/31/2016 Elsevier Interactive Patient Education  2017 [employer] Vaginal Bleeding During Pregnancy, First Trimester A small amount of bleeding (spotting) from the vagina is common in early pregnancy. Sometimes the bleeding is normal and is not a problem, and sometimes it is a sign of something serious. Be sure to tell your doctor about any bleeding from your vagina right away. Follow these instructions at home:  Watch your condition for any changes.  Follow your doctor's instructions about how active you can be.  If you are on bed rest: ? You may need to stay in bed and only get up  to use the bathroom. ? You may be allowed to do some activities. ? If you need help, make plans for someone to help you.  Write down: ? The number of pads you use each day. ? How often you change pads. ? How soaked (saturated) your pads are.  Do not use tampons.  Do not douche.  Do not have sex or orgasms until your doctor says it is okay.  If you pass any tissue from your vagina, save the tissue so you can show it to your doctor.  Only take medicines as told by your doctor.  Do not  take aspirin because it can make you bleed.  Keep all follow-up visits as told by your doctor. Contact a doctor if:  You bleed from your vagina.  You have cramps.  You have labor pains.  You have a fever that does not go away after you take medicine. Get help right away if:  You have very bad cramps in your back or belly (abdomen).  You pass large clots or tissue from your vagina.  You bleed more.  You feel light-headed or weak.  You pass out (faint).  You have chills.  You are leaking fluid or have a gush of fluid from your vagina.  You pass out while pooping (having a bowel movement). This information is not intended to replace advice given to you by your health care provider. Make sure you discuss any questions you have with your health care provider. Document Released: 01/30/2014 Document Revised: 02/21/2016 Document Reviewed: 05/23/2013 Elsevier Interactive Patient Education  [employer].

## 2017-06-25 NOTE — MAU Note (Signed)
Pt reports she recently had a positive home preg test and started bleeding 2 days ago.

## 2017-06-26 LAB — GC/CHLAMYDIA PROBE AMP (~~LOC~~) NOT AT ARMC
CHLAMYDIA, DNA PROBE: NEGATIVE
NEISSERIA GONORRHEA: NEGATIVE

## 2017-06-26 LAB — HIV ANTIBODY (ROUTINE TESTING W REFLEX): HIV Screen 4th Generation wRfx: NONREACTIVE

## 2017-07-06 ENCOUNTER — Telehealth: Payer: Self-pay | Admitting: *Deleted

## 2017-07-06 DIAGNOSIS — Z3490 Encounter for supervision of normal pregnancy, unspecified, unspecified trimester: Secondary | ICD-10-CM

## 2017-07-06 NOTE — Telephone Encounter (Signed)
-----   Message from Lesly Dukes, MD sent at 07/02/2017  8:20 AM EDT ----- Pt did not come for follow up beta hcg.  Can you all her and see how she is doing?  She needs an [place] 14 days after her last [place] for viability.  If she will ome to office for beta hcg that would be great!

## 2017-07-06 NOTE — Telephone Encounter (Signed)
I scheduled an [place] appt for 07/10/17 3pm . I called [facility] and left a message we have scheduled an [place] for 07/10/17 3pm . We also need to schedule a lab appointment. Please call our office back.

## 2017-07-06 NOTE — Telephone Encounter (Signed)
-----   Message from Kelly H Leggett, MD sent at 07/02/2017  8:20 AM EDT ----- Pt did not come for follow up beta hcg.  Can you all her and see how she is doing?  She needs an US 14 days after her last US for viability.  If she will ome to office for beta hcg that would be great! 

## 2017-07-07 NOTE — Telephone Encounter (Signed)
Pt aware of upcoming appt.

## 2017-07-10 ENCOUNTER — Ambulatory Visit (HOSPITAL_COMMUNITY): Admission: RE | Admit: 2017-07-10 | Payer: Self-pay | Source: Ambulatory Visit

## 2017-09-11 ENCOUNTER — Encounter (HOSPITAL_COMMUNITY): Payer: Self-pay | Admitting: *Deleted

## 2017-09-11 ENCOUNTER — Inpatient Hospital Stay (HOSPITAL_COMMUNITY)
Admission: AD | Admit: 2017-09-11 | Discharge: 2017-09-11 | Disposition: A | Discharge status: Home / Self Care | Payer: Self-pay | Source: Ambulatory Visit | Attending: Obstetrics and Gynecology | Admitting: Obstetrics and Gynecology

## 2017-09-11 DIAGNOSIS — R109 Unspecified abdominal pain: Secondary | ICD-10-CM

## 2017-09-11 DIAGNOSIS — N760 Acute vaginitis: Secondary | ICD-10-CM

## 2017-09-11 DIAGNOSIS — O23592 Infection of other part of genital tract in pregnancy, second trimester: Secondary | ICD-10-CM | POA: Insufficient documentation

## 2017-09-11 DIAGNOSIS — B9689 Other specified bacterial agents as the cause of diseases classified elsewhere: Secondary | ICD-10-CM | POA: Insufficient documentation

## 2017-09-11 DIAGNOSIS — Z3A16 16 weeks gestation of pregnancy: Secondary | ICD-10-CM | POA: Insufficient documentation

## 2017-09-11 DIAGNOSIS — O26892 Other specified pregnancy related conditions, second trimester: Secondary | ICD-10-CM

## 2017-09-11 LAB — URINALYSIS, ROUTINE W REFLEX MICROSCOPIC
Bacteria, UA: NONE SEEN
Bilirubin Urine: NEGATIVE
Glucose, UA: NEGATIVE mg/dL
Hgb urine dipstick: NEGATIVE
Ketones, ur: NEGATIVE mg/dL
Nitrite: NEGATIVE
PH: 6 (ref 5.0–8.0)
Protein, ur: NEGATIVE mg/dL
SPECIFIC GRAVITY, URINE: 1.026 (ref 1.005–1.030)

## 2017-09-11 LAB — WET PREP, GENITAL
TRICH WET PREP: NONE SEEN
Yeast Wet Prep HPF POC: NONE SEEN

## 2017-09-11 MED ORDER — METRONIDAZOLE 500 MG PO TABS: 500.0000 mg | ORAL_TABLET | Freq: Two times a day (BID) | ORAL | 0 refills | Status: DC

## 2017-09-11 NOTE — MAU Provider Note (Signed)
History     CSN: [id number]  Arrival date and time: 09/11/17 [id number]   First Provider Initiated Contact with Patient 09/11/17 2051      Chief Complaint  Patient presents with  . Abdominal Pain   HPI Jennifer Prince 21 y.o. 8384w2d  Comes to MAU as she has had lower abdominal cramping and has not really felt pregnant.  Thought she was having a miscarriage.  Has not started prenatal care. Does feel the baby move from time to time.  Needs proof of pregnancy to apply for Medicaid.  OB History    Gravida Para Term Preterm AB Living   2 1 1  0 0 1   SAB TAB Ectopic Multiple Live Births   0 0 0 0 1      Past Medical History:  Diagnosis Date  . Medical history non-contributory     Past Surgical History:  Procedure Laterality Date  . NO PAST SURGERIES      History reviewed. No pertinent family history.  Social History   Tobacco Use  . Smoking status: Current Some Day Smoker  . Smokeless tobacco: Current User  Substance Use Topics  . Alcohol use: No  . Drug use: No    Allergies: No Known Allergies  Medications Prior to Admission  Medication Sig Dispense Refill Last Dose  . metroNIDAZOLE (FLAGYL) 500 MG tablet Take 1 tablet (500 mg total) by mouth 2 (two) times daily. 14 tablet 0     Review of Systems  Constitutional: Negative for fever.  Gastrointestinal: Positive for abdominal pain. Negative for nausea and vomiting.  Genitourinary: Negative for vaginal bleeding and vaginal discharge.   Physical Exam   Blood pressure (!) 93/56, pulse 91, temperature 99 F (37.2 C), temperature source Oral, resp. rate 16, height 6' (1.829 m), weight 149 lb (67.6 kg), last menstrual period 05/16/2017, SpO2 100 %, unknown if currently breastfeeding.  Physical Exam  Nursing note and vitals reviewed. Constitutional: She is oriented to person, place, and time. She appears well-developed and well-nourished.  HENT:  Head: Normocephalic.  Eyes: EOM are normal.  Neck: Neck supple.  GI: Soft.  There is no tenderness. There is no rebound and no guarding.  FHT heard by doppler by RN  Genitourinary:  Genitourinary Comments: Speculum exam: Vagina - Mod amount of creamy yellow discharge, no odor Cervix - No contact bleeding, Bimanual exam: Cervix closed Uterus 16 week size Adnexa non tender, no masses bilaterally GC/Chlam, wet prep done Chaperone present for exam.   Musculoskeletal: Normal range of motion.  Neurological: She is alert and oriented to person, place, and time.  Skin: Skin is warm and dry.  Psychiatric: She has a normal mood and affect.    MAU Course  Procedures Results for orders placed or performed during the hospital encounter of 09/11/17 (from the past 24 hour(s))  Urinalysis, Routine w reflex microscopic     Status: Abnormal   Collection Time: 09/11/17  6:46 PM  Result Value Ref Range   Color, Urine YELLOW YELLOW   APPearance CLEAR CLEAR   Specific Gravity, Urine 1.026 1.005 - 1.030   pH 6.0 5.0 - 8.0   Glucose, UA NEGATIVE NEGATIVE mg/dL   Hgb urine dipstick NEGATIVE NEGATIVE   Bilirubin Urine NEGATIVE NEGATIVE   Ketones, ur NEGATIVE NEGATIVE mg/dL   Protein, ur NEGATIVE NEGATIVE mg/dL   Nitrite NEGATIVE NEGATIVE   Leukocytes, UA TRACE (A) NEGATIVE   RBC / HPF 0-5 0 - 5 RBC/hpf   WBC, UA 6-30 0 -  5 WBC/hpf   Bacteria, UA NONE SEEN NONE SEEN   Squamous Epithelial / LPF 0-5 (A) NONE SEEN   Mucus PRESENT   Wet prep, genital     Status: Abnormal   Collection Time: 09/11/17  8:52 PM  Result Value Ref Range   Yeast Wet Prep HPF POC NONE SEEN NONE SEEN   Trich, Wet Prep NONE SEEN NONE SEEN   Clue Cells Wet Prep HPF POC PRESENT (A) NONE SEEN   WBC, Wet Prep HPF POC MANY (A) NONE SEEN   Sperm PRESENT     MDM Message sent to the clinic to schedule for prenatal care.  Verification of pregnancy printed for client to apply for Medicaid. Was treated for trich in this pregnancy but currently not seen on wet prep.  Assessment and Plan  Pregnancy  [redacted]  weeks gestation Bacterial vaginosis  Plan Drink at least 8 8-oz glasses of water every day. Take Tylenol 325 mg 2 tablets by mouth every 4 hours if needed for pain. No smoking, no drugs,. No alcohol. Take an over the counter prenatal vitamin by mouth daily. Given a print prescription for Metronidazole 500 mg PO BID x 7 days (#14) no refills. Advised client to get this prescription filled - likely will be $7.  As she does not yet have any insurance.  Terri L Burleson 09/11/2017, 8:51 PM

## 2017-09-11 NOTE — MAU Note (Signed)
Pt reports she had a positive preg test in sept and is cramping and doesn't feel like she is preg any longer. Denies bleeding

## 2017-09-11 NOTE — Discharge Instructions (Signed)
Speculum exam: Vagina - Mod amount of yellow frothy discharge - one tiny spot of blood seen in the discharge, no odor Cervix - No contact bleeding Bimanual exam: Cervix soft but closed GC/Chlam, wet prep done Chaperone present for exam.

## 2017-09-14 LAB — GC/CHLAMYDIA PROBE AMP (~~LOC~~) NOT AT ARMC
Chlamydia: NEGATIVE
Neisseria Gonorrhea: NEGATIVE

## 2017-09-29 NOTE — L&D Delivery Note (Addendum)
Delivery Note At 1:17 PM a viable and healthy female was delivered via Vaginal, Spontaneous (Presentation: cephalic; ROA  ).  APGAR: 9, 9; Placenta status: spontaneous, intact .  Cord: 3 vessel Complications: none  Anesthesia: epidural Episiotomy: None Lacerations:  none Suture Repair: N/A Est. Blood Loss (mL):  Mom to postpartum.  Baby to Couplet care / Skin to Skin.  Myrene Buddy 01/31/2018, 1:30 PM  I confirm that I have verified the information documented in the resident's note and that I have also personally reperformed the physical exam and all medical decision making activities.  I certify that I was present and gloved for the delivery of infant and placenta, and that I agree with the above documentation.    Please schedule this patient for Postpartum visit in: 4 weeks with the following provider: Any provider For C/S patients schedule nurse incision check in weeks 2 weeks: no Low risk pregnancy complicated by: nothing Delivery mode:  SVD Anticipated Birth Control:  nothing.  PP Procedures needed: none  Schedule Integrated BH visit: no

## 2017-10-05 ENCOUNTER — Encounter: Payer: Self-pay | Admitting: Advanced Practice Midwife

## 2017-10-05 ENCOUNTER — Ambulatory Visit (INDEPENDENT_AMBULATORY_CARE_PROVIDER_SITE_OTHER): Payer: Self-pay | Admitting: Advanced Practice Midwife

## 2017-10-05 DIAGNOSIS — Z3482 Encounter for supervision of other normal pregnancy, second trimester: Secondary | ICD-10-CM

## 2017-10-05 DIAGNOSIS — Z3480 Encounter for supervision of other normal pregnancy, unspecified trimester: Secondary | ICD-10-CM

## 2017-10-05 DIAGNOSIS — Z113 Encounter for screening for infections with a predominantly sexual mode of transmission: Secondary | ICD-10-CM

## 2017-10-05 LAB — POCT URINALYSIS DIP (DEVICE)
BILIRUBIN URINE: NEGATIVE
Glucose, UA: NEGATIVE mg/dL
Ketones, ur: NEGATIVE mg/dL
NITRITE: NEGATIVE
PH: 6 (ref 5.0–8.0)
Protein, ur: NEGATIVE mg/dL
Specific Gravity, Urine: >=1.03 (ref 1.005–1.030)
Urobilinogen, UA: 0.2 mg/dL (ref 0.0–1.0)

## 2017-10-05 NOTE — Patient Instructions (Addendum)
Places to have your son circumcised:    [facility] [phone] while you are in hospital  [facility] [phone] $244 by 4 wks  [facility] [phone] $175 by 2 wks  [facility] [phone] $250 by 7 days [facility] [phone] $150 by 4 wks  These prices sometimes change but are roughly what you can expect to pay. Please call and confirm pricing.   Circumcision is considered an elective/non-medically necessary procedure. There are many reasons parents decide to have their sons circumsized. During the first year of life circumcised males have a reduced risk of urinary tract infections but after this year the rates between circumcised males and uncircumcised males are the same.  It is safe to have your son circumcised outside of the hospital and the places above perform them regularly.  [facility] [place] [address], [address] [place], [place]  [address] Phone - [phone]   Fax - [phone]  [facility] [address] [place], [place] [address] Phone - [phone]   Fax - [place] [address] [place], [place]  [address] Phone - [phone]   Fax - [phone]  [place] [address] [place] [address] [place], [place]  [address] Phone - [phone]   Fax - [phone]  [facility] [address] [place], [place]  [address] Phone - [phone]   Fax - [phone]  [facility] [address] [place], [place]  [address] Phone - [phone]   Fax - [place] [address] [place], [place]  [address] Phone - [phone]   Fax - [phone]  [place] [address] [place], [place]  [address] [place], [place]  [address] Phone - [phone]   Fax - [place] [address] [place], [place]  [address] Phone - [phone]   Fax - [phone] [facility] [place]. [address] [place], [place]  [address] Phone - [phone]   Fax - [phone]  [place] [place] [address] [place], [place]  [address] Phone - [phone]   Fax - [phone]  [facility] AT [place], [place], [place]  [address] Phone - [phone]   Fax - [place] [address], [place], [place]  [address] Phone - [phone]   Fax - [phone]  [facility] [address] [place], [place]  [address] Phone - [place], [place]  [address] Phone - [phone]   Fax - [facility] [address], [place], [place]  [address] Phone - [phone]   Fax - [phone]  [place] [address] [place], [place]  [address] Phone - [phone]   Fax - [place]. [place], [place]  [address] Phone - [phone]   Fax - [place], [place], [place]  [address] Phone - [phone]   Fax - [place] [address], [place], [place]  [address] Phone - [phone]   Fax - [phone]  [facility] [address] [place], [place]  [address] Phone - [phone]   Fax - [place] [place], [place]  [address] Phone - [phone]   Fax - [phone]  [facility] [place] [address] [place], [place]  [address] Phone - [phone]   Fax - [phone] [facility] [place] [address] [place], [place]  [address] Phone - [phone]   Fax - [phone]  [facility] [place] [address] [place], [place]  [address] Phone - [phone]   Fax - [phone]  [facility] - [place] [address] [place], [place]  [address] Phone - [phone]   Fax - [phone]  [facility] [place] Wyvonne Lenzharlene Flemming MD [address] [place] [address] Phone [phone]  Fax [phone]  Childbirth Education Options: [facility] Classes:  Childbirth education classes can help you get ready for a positive parenting experience. You can also meet other expectant parents and get free stuff for your baby. Each class runs for five weeks on the same night and costs $45 for the mother-to-be and her support person. Medicaid covers the cost if you are eligible. Call [phone] to register. [facility] Childbirth Education:  [phone] or [phone] or [email or web address]  Baby & Me Class: Discuss newborn & infant parenting and family adjustment issues with other new mothers in a relaxed environment. Each week brings a new speaker or baby-centered activity. We encourage new mothers to join us every Thursday at 11:00am. Babies birth until crawling. No registration or fee. Daddy MeadWestvaco[employer] Camp: This course offers Dads-to-be the tools and knowledge needed to feel confident on their journey to becoming new fathers. Experienced dads, who have been trained as coaches, teach dads-to-be how to hold, comfort, diaper, swaddle and play with their infant while being able to support the new mom as well. A class for men taught by men. $25/dad Big Brother/Big Sister: Let your children share in the joy of a new brother or sister in this special class designed just for them. Class includes discussion about how families care for babies: swaddling, holding, diapering, safety as well as how they can be helpful in  their new role. This class is designed for children ages 2 to 356, but any age is welcome. Please register each child individually. $5/child  Mom Talk: This mom-led group offers support and connection to mothers as they journey through the adjustments and struggles of that sometimes overwhelming first year after the birth of a child. Tuesdays at 10:00am and Thursdays at 6:00pm. Babies welcome. No registration or fee. Breastfeeding Support Group: This group is a mother-to-mother support circle where moms have the opportunity to share their breastfeeding experiences. A Lactation Consultant is present for questions and concerns. Meets each Tuesday at 11:00am. No fee or registration. Breastfeeding Your Baby: Learn what to expect in the first days of breastfeeding your newborn.  This class will help you feel more confident with the skills needed to begin your breastfeeding experience. Many new mothers are concerned about breastfeeding after leaving the hospital. This class will also address the most common fears and challenges about breastfeeding during the first few weeks, months and beyond. (call for fee) Comfort Techniques and Tour: This 2 hour interactive class will provide you the opportunity to learn & practice hands-on techniques that can help relieve some of the discomfort of labor and encourage your baby to rotate toward the best position for birth. You and your partner will be able to try a variety of labor positions with birth balls and rebozos as well as practice breathing,  relaxation, and visualization techniques. A tour of the [facility] is included with this class. $20 per registrant and support person Childbirth Class- Weekend Option: This class is a Weekend version of our Birth & Baby series. It is designed for parents who have a difficult time fitting several weeks of classes into their schedule. It covers the care of your newborn and the basics of labor and childbirth. It  also includes a Maternity Care Center Tour of [facility] and lunch. The class is held two consecutive days: beginning on Friday evening from 6:30 - 8:30 p.m. and the next day, Saturday from 9 a.m. - 4 p.m. (call for fee) Linden Dolin Class: Interested in a waterbirth?  This informational class will help you discover whether waterbirth is the right fit for you. Education about waterbirth itself, supplies you would need and how to assemble your support team is what you can expect from this class. Some obstetrical practices require this class in order to pursue a waterbirth. (Not all obstetrical practices offer waterbirth-check with your healthcare provider.) Register only the expectant mom, but you are encouraged to bring your partner to class! Required if planning waterbirth, no fee. Infant/Child CPR: Parents, grandparents, babysitters, and friends learn Cardio-Pulmonary Resuscitation skills for infants and children. You will also learn how to treat both conscious and unconscious choking in infants and children. This Family & Friends program does not offer certification. Register each participant individually to ensure that enough mannequins are available. (Call for fee) Grandparent Love: Expecting a grandbaby? This class is for you! Learn about the latest infant care and safety recommendations and ways to support your own child as he or she transitions into the parenting role. Taught by Registered Nurses who are childbirth instructors, but most importantly...they are grandmothers too! $10/person. Childbirth Class- Natural Childbirth: This series of 5 weekly classes is for expectant parents who want to learn and practice natural methods of coping with the process of labor and childbirth. Relaxation, breathing, massage, visualization, role of the partner, and helpful positioning are highlighted. Participants learn how to be confident in their body's ability to give birth. This class will empower and help  parents make informed decisions about their own care. Includes discussion that will help new parents transition into the immediate postpartum period. Maternity Care Center Tour of [facility] is included. We suggest taking this class between 25-32 weeks, but it's only a recommendation. $75 per registrant and one support person or $30 Medicaid. Childbirth Class- 3 week Series: This option of 3 weekly classes helps you and your labor partner prepare for childbirth. Newborn care, labor & birth, cesarean birth, pain management, and comfort techniques are discussed and a Maternity Care Center Tour of [facility] is included. The class meets at the same time, on the same day of the week for 3 consecutive weeks beginning with the starting date you choose. $60 for registrant and one support person.  Marvelous Multiples: Expecting twins, triplets, or more? This class covers the differences in labor, birth, parenting, and breastfeeding issues that face multiples' parents. NICU tour is included. Led by a Certified Childbirth Educator who is the mother of twins. No fee. Caring for Baby: This class is for expectant and adoptive parents who want to learn and practice the most up-to-date newborn care for their babies. Focus is on birth through the first six weeks of life. Topics include feeding, bathing, diapering, crying, umbilical cord care, circumcision care and safe sleep. Parents learn to recognize symptoms of  illness and when to call the pediatrician. Register only the mom-to-be and your partner or support person can plan to come with you! $10 per registrant and support person Childbirth Class- online option: This online class offers you the freedom to complete a Birth and Baby series in the comfort of your own home. The flexibility of this option allows you to review sections at your own pace, at times convenient to you and your support people. It includes additional video information, animations, quizzes, and  extended activities. Get organized with helpful eClass tools, checklists, and trackers. Once you register online for the class, you will receive an email within a few days to accept the invitation and begin the class when the time is right for you. The content will be available to you for 60 days. $60 for 60 days of online access for you and your support people.  Local Doulas: Natural Baby Doulas [email or web address] Tel: [phone] [email or web address] [employer]oulas [phone] [email or web address] The Labor Merla RichesLadies  (also do waterbirth tub rental) [phone] [email or web address] Triad Birth Doula [phone] [email or web address] [facility] [place] [facility] Rhythms  [phone] [email or web address] [employer] Doula [employer] (PADA) [email or web address] [place] Birth and Baby  [email or web address]

## 2017-10-05 NOTE — Progress Notes (Signed)
Fm

## 2017-10-05 NOTE — Progress Notes (Signed)
  Subjective:    Jennifer Prince is being seen today for her first obstetrical visit.  This is a planned pregnancy. She is at 5218w5d gestation. Her obstetrical history is significant for None. Relationship with FOB: significant other, living together. Patient does not intend to breast feed. Pregnancy history fully reviewed.  Patient reports no complaints.  Review of Systems:   Review of Systems  All other systems reviewed and are negative.   Objective:     BP 106/61   Pulse (!) 102   Wt 140 lb 9.6 oz (63.8 kg)   LMP 05/16/2017   BMI 19.07 kg/m  Physical Exam  Nursing note and vitals reviewed. Constitutional: She is oriented to person, place, and time. She appears well-developed and well-nourished. No distress.  Cardiovascular: Normal rate.  Respiratory: Effort normal.  GI: Soft. There is no tenderness. There is no rebound.  Neurological: She is alert and oriented to person, place, and time.  Skin: Skin is warm and dry.  Psychiatric: She has a normal mood and affect.      Fetal Exam Fetal Monitor Review: Baseline rate: 144 with doppler .         Assessment:    Pregnancy: G2P1001 Patient Active Problem List   Diagnosis Date Noted  . Supervision of other normal pregnancy, antepartum 10/05/2017  . Vaginal delivery 11/18/2013       Plan:     Initial labs drawn. Prenatal vitamins. Problem list reviewed and updated. AFP3 discussed: requested. Role of ultrasound in pregnancy discussed; fetal survey: requested. Amniocentesis discussed: not indicated. Follow up in 8  weeks. 50% of 60 min visit spent on counseling and coordination of care.    Thressa ShellerHeather Ily Denno 10/05/2017

## 2017-10-06 ENCOUNTER — Encounter (HOSPITAL_COMMUNITY): Payer: Self-pay | Admitting: Advanced Practice Midwife

## 2017-10-06 LAB — GC/CHLAMYDIA PROBE AMP (~~LOC~~) NOT AT ARMC
CHLAMYDIA, DNA PROBE: NEGATIVE
NEISSERIA GONORRHEA: NEGATIVE

## 2017-10-08 ENCOUNTER — Ambulatory Visit (HOSPITAL_COMMUNITY)
Admission: RE | Admit: 2017-10-08 | Discharge: 2017-10-08 | Disposition: A | Discharge status: Home / Self Care | Payer: Self-pay | Source: Ambulatory Visit | Attending: Advanced Practice Midwife | Admitting: Advanced Practice Midwife

## 2017-10-08 ENCOUNTER — Other Ambulatory Visit: Payer: Self-pay | Admitting: Advanced Practice Midwife

## 2017-10-08 DIAGNOSIS — Z3A20 20 weeks gestation of pregnancy: Secondary | ICD-10-CM

## 2017-10-08 DIAGNOSIS — Z363 Encounter for antenatal screening for malformations: Secondary | ICD-10-CM | POA: Insufficient documentation

## 2017-10-08 DIAGNOSIS — Z3480 Encounter for supervision of other normal pregnancy, unspecified trimester: Secondary | ICD-10-CM

## 2017-10-08 DIAGNOSIS — Z369 Encounter for antenatal screening, unspecified: Secondary | ICD-10-CM

## 2017-10-08 LAB — CULTURE, OB URINE

## 2017-10-08 LAB — URINE CULTURE, OB REFLEX

## 2017-10-10 ENCOUNTER — Other Ambulatory Visit: Payer: Self-pay | Admitting: Advanced Practice Midwife

## 2017-10-10 DIAGNOSIS — Z3480 Encounter for supervision of other normal pregnancy, unspecified trimester: Secondary | ICD-10-CM

## 2017-10-10 LAB — OBSTETRIC PANEL, INCLUDING HIV
ANTIBODY SCREEN: NEGATIVE
BASOS: 0 %
Basophils Absolute: 0 10*3/uL (ref 0.0–0.2)
EOS (ABSOLUTE): 0.4 10*3/uL (ref 0.0–0.4)
EOS: 5 %
HEMATOCRIT: 35.1 % (ref 34.0–46.6)
HEMOGLOBIN: 11.8 g/dL (ref 11.1–15.9)
HIV SCREEN 4TH GENERATION: NONREACTIVE
Hepatitis B Surface Ag: NEGATIVE
Immature Grans (Abs): 0 10*3/uL (ref 0.0–0.1)
Immature Granulocytes: 0 %
LYMPHS ABS: 1 10*3/uL (ref 0.7–3.1)
Lymphs: 13 %
MCH: 31.6 pg (ref 26.6–33.0)
MCHC: 33.6 g/dL (ref 31.5–35.7)
MCV: 94 fL (ref 79–97)
MONOS ABS: 0.8 10*3/uL (ref 0.1–0.9)
Monocytes: 11 %
NEUTROS ABS: 5.8 10*3/uL (ref 1.4–7.0)
Neutrophils: 71 %
Platelets: 257 10*3/uL (ref 150–379)
RBC: 3.74 x10E6/uL — AB (ref 3.77–5.28)
RDW: 14.8 % (ref 12.3–15.4)
RH TYPE: POSITIVE
RPR Ser Ql: NONREACTIVE
Rubella Antibodies, IGG: 1.93 index (ref >0.99)
WBC: 8 10*3/uL (ref 3.4–10.8)

## 2017-10-10 LAB — AFP TETRA
DIA Mom Value: 0.78
DIA VALUE (EIA): 146.17 pg/mL
DSR (By Age)    1 IN: 1153
DSR (SECOND TRIMESTER) 1 IN: 10000
GESTATIONAL AGE AFP: 19 wk
MSAFP Mom: 0.81
MSAFP: 45.1 ng/mL
MSHCG Mom: 0.58
MSHCG: 14956 m[IU]/mL
Maternal Age At EDD: 20.7 yr
OSB RISK: 10000
T18 (By Age): 1:4493 {titer}
Test Results:: NEGATIVE
UE3 MOM: 1.46
UE3 VALUE: 2.36 ng/mL
WEIGHT: 140 [lb_av]

## 2017-11-02 ENCOUNTER — Encounter: Payer: Self-pay | Admitting: Advanced Practice Midwife

## 2017-11-04 ENCOUNTER — Encounter: Payer: Self-pay | Admitting: Family Medicine

## 2017-11-06 ENCOUNTER — Ambulatory Visit (INDEPENDENT_AMBULATORY_CARE_PROVIDER_SITE_OTHER): Payer: Self-pay | Admitting: Obstetrics and Gynecology

## 2017-11-06 VITALS — BP 101/63 | HR 89 | Wt 151.4 lb

## 2017-11-06 DIAGNOSIS — Z3480 Encounter for supervision of other normal pregnancy, unspecified trimester: Secondary | ICD-10-CM

## 2017-11-06 DIAGNOSIS — Z3482 Encounter for supervision of other normal pregnancy, second trimester: Secondary | ICD-10-CM

## 2017-11-06 NOTE — Progress Notes (Signed)
   PRENATAL VISIT NOTE  Subjective:  Jennifer Prince is a 22 y.o. G2P1001 at 1248w2d being seen today for ongoing prenatal care.  She is currently monitored for the following issues for this low-risk pregnancy and has Supervision of other normal pregnancy, antepartum on their problem list.  Patient reports no complaints.  Contractions: Not present. Vag. Bleeding: None.  Movement: Present. Denies leaking of fluid.   The following portions of the patient's history were reviewed and updated as appropriate: allergies, current medications, past family history, past medical history, past social history, past surgical history and problem list. Problem list updated.  Objective:   Vitals:   11/06/17 0941  BP: 101/63  Pulse: 89  Weight: 151 lb 6.4 oz (68.7 kg)    Fetal Status: Fetal Heart Rate (bpm): 153 Fundal Height: 23 cm Movement: Present     General:  Alert, oriented and cooperative. Patient is in no acute distress.  Skin: Skin is warm and dry. No rash noted.   Cardiovascular: Normal heart rate noted  Respiratory: Normal respiratory effort, no problems with respiration noted  Abdomen: Soft, gravid, appropriate for gestational age.  Pain/Pressure: Absent     Pelvic: Cervical exam deferred        Extremities: Normal range of motion.  Edema: None  Mental Status:  Normal mood and affect. Normal behavior. Normal judgment and thought content.   Assessment and Plan:  Pregnancy: G2P1001 at 7148w2d  1. Supervision of other normal pregnancy, antepartum  - US MFM OB FOLLOW UP; Future  There are no diagnoses linked to this encounter. Preterm labor symptoms and general obstetric precautions including but not limited to vaginal bleeding, contractions, leaking of fluid and fetal movement were reviewed in detail with the patient. Please refer to After Visit Summary for other counseling recommendations.  Return in about 4 weeks (around 12/04/2017) for For 2 hour GTT- come fasting .   Venia CarbonJennifer Rasch, NP

## 2017-11-06 NOTE — Progress Notes (Signed)
Pt declined  Flu shot

## 2017-11-09 ENCOUNTER — Ambulatory Visit (HOSPITAL_COMMUNITY): Payer: Self-pay | Attending: Obstetrics and Gynecology

## 2017-12-03 ENCOUNTER — Other Ambulatory Visit: Payer: Self-pay | Admitting: General Practice

## 2017-12-03 DIAGNOSIS — Z3480 Encounter for supervision of other normal pregnancy, unspecified trimester: Secondary | ICD-10-CM

## 2017-12-04 ENCOUNTER — Encounter: Payer: Self-pay | Admitting: Medical

## 2017-12-04 ENCOUNTER — Other Ambulatory Visit: Payer: Self-pay

## 2017-12-04 ENCOUNTER — Ambulatory Visit (INDEPENDENT_AMBULATORY_CARE_PROVIDER_SITE_OTHER): Payer: Self-pay | Admitting: Medical

## 2017-12-04 VITALS — BP 103/63 | HR 78 | Wt 157.0 lb

## 2017-12-04 DIAGNOSIS — Z3480 Encounter for supervision of other normal pregnancy, unspecified trimester: Secondary | ICD-10-CM

## 2017-12-04 NOTE — Progress Notes (Signed)
   PRENATAL VISIT NOTE  Subjective:  Jennifer Prince is a 22 y.o. G2P1001 at 6672w2d being seen today for ongoing prenatal care.  She is currently monitored for the following issues for this low-risk pregnancy and has Supervision of other normal pregnancy, antepartum on their problem list.  Patient reports no complaints.  Contractions: Not present. Vag. Bleeding: None.  Movement: Present. Denies leaking of fluid.   The following portions of the patient's history were reviewed and updated as appropriate: allergies, current medications, past family history, past medical history, past social history, past surgical history and problem list. Problem list updated.  Objective:   Vitals:   12/04/17 1142  BP: 103/63  Pulse: 78  Weight: 157 lb (71.2 kg)    Fetal Status: Fetal Heart Rate (bpm): 154 Fundal Height: 27 cm Movement: Present     General:  Alert, oriented and cooperative. Patient is in no acute distress.  Skin: Skin is warm and dry. No rash noted.   Cardiovascular: Normal heart rate noted  Respiratory: Normal respiratory effort, no problems with respiration noted  Abdomen: Soft, gravid, appropriate for gestational age.  Pain/Pressure: Absent     Pelvic: Cervical exam deferred        Extremities: Normal range of motion.  Edema: None  Mental Status:  Normal mood and affect. Normal behavior. Normal judgment and thought content.   Assessment and Plan:  Pregnancy: G2P1001 at 2372w2d  1. Supervision of other normal pregnancy, antepartum - 2 hour GTT today - CBC - HIV - RPR  Preterm labor symptoms and general obstetric precautions including but not limited to vaginal bleeding, contractions, leaking of fluid and fetal movement were reviewed in detail with the patient. Please refer to After Visit Summary for other counseling recommendations.  Return in about 2 weeks (around 12/18/2017) for LOB.   Vonzella NippleJulie Audreena Sachdeva, PA-C

## 2017-12-04 NOTE — Patient Instructions (Signed)

## 2017-12-05 LAB — CBC
Hematocrit: 30.2 % — ABNORMAL LOW (ref 34.0–46.6)
Hemoglobin: 10.2 g/dL — ABNORMAL LOW (ref 11.1–15.9)
MCH: 31.3 pg (ref 26.6–33.0)
MCHC: 33.8 g/dL (ref 31.5–35.7)
MCV: 93 fL (ref 79–97)
PLATELETS: 226 10*3/uL (ref 150–379)
RBC: 3.26 x10E6/uL — ABNORMAL LOW (ref 3.77–5.28)
RDW: 14.6 % (ref 12.3–15.4)
WBC: 9.2 10*3/uL (ref 3.4–10.8)

## 2017-12-05 LAB — RPR: RPR: NONREACTIVE

## 2017-12-05 LAB — HIV ANTIBODY (ROUTINE TESTING W REFLEX): HIV Screen 4th Generation wRfx: NONREACTIVE

## 2017-12-05 LAB — GLUCOSE TOLERANCE, 2 HOURS W/ 1HR
GLUCOSE, 1 HOUR: 72 mg/dL (ref 65–179)
GLUCOSE, FASTING: 72 mg/dL (ref 65–91)
Glucose, 2 hour: 65 mg/dL (ref 65–152)

## 2017-12-07 ENCOUNTER — Ambulatory Visit (HOSPITAL_COMMUNITY): Admission: RE | Admit: 2017-12-07 | Payer: Self-pay | Source: Ambulatory Visit

## 2017-12-08 ENCOUNTER — Ambulatory Visit (HOSPITAL_COMMUNITY)
Admission: RE | Admit: 2017-12-08 | Discharge: 2017-12-08 | Disposition: A | Discharge status: Home / Self Care | Payer: Medicaid Other | Source: Ambulatory Visit | Attending: Obstetrics and Gynecology | Admitting: Obstetrics and Gynecology

## 2017-12-08 DIAGNOSIS — Z3480 Encounter for supervision of other normal pregnancy, unspecified trimester: Secondary | ICD-10-CM | POA: Diagnosis present

## 2017-12-08 DIAGNOSIS — Z3A29 29 weeks gestation of pregnancy: Secondary | ICD-10-CM | POA: Insufficient documentation

## 2017-12-08 DIAGNOSIS — Z3689 Encounter for other specified antenatal screening: Secondary | ICD-10-CM | POA: Diagnosis present

## 2017-12-08 DIAGNOSIS — Z3483 Encounter for supervision of other normal pregnancy, third trimester: Secondary | ICD-10-CM | POA: Insufficient documentation

## 2017-12-18 ENCOUNTER — Ambulatory Visit (INDEPENDENT_AMBULATORY_CARE_PROVIDER_SITE_OTHER): Payer: Self-pay | Admitting: Advanced Practice Midwife

## 2017-12-18 VITALS — BP 117/74 | HR 91 | Wt 153.0 lb

## 2017-12-18 DIAGNOSIS — Z3480 Encounter for supervision of other normal pregnancy, unspecified trimester: Secondary | ICD-10-CM

## 2017-12-18 NOTE — Progress Notes (Signed)
   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  She is currently monitored for the following issues for this low-risk pregnancy and has Supervision of other normal pregnancy, antepartum on their problem list.   Patient reports no complaints.  Contractions: Not present. Vag. Bleeding: None. Movement: Present  Denies leaking of fluid.   The following portions of the patient's history were reviewed and updated as appropriate: allergies, current medications, past family history, past medical history, past social history, past surgical history and problem list. Problem list updated.  Objective:   Vitals:   12/18/17 1051  BP: 117/74  Pulse: 91  Weight: 153 lb (69.4 kg)    Fetal Status: Fetal Heart Rate(bpm): 150    Fundal Height: 29     Movement: Present General:  Alert, oriented and cooperative. Patient is in no acute distress.  Skin: Skin is warm and dry. No rash noted.   Cardiovascular: Normal heart rate noted  Respiratory: Normal respiratory effort, no problems with respiration noted  Abdomen: Soft, gravid, appropriate for gestational age. Pain/Pressure: Absent  Pelvic: Cervical exam deferred   Extremities: Normal range of motion.  Edema: None  Mental Status:  Normal mood and affect. Normal behavior. Normal judgment and thought content.   Assessment and Plan:  Pregnancy: G2P1001 at 5452w2d  1. Supervision of other normal pregnancy, antepartum  Preterm labor symptoms and general obstetric precautions including but not limited to vaginal bleeding, contractions, leaking of fluid and fetal movement were reviewed in detail with the patient. Please refer to After Visit Summary for other counseling recommendations.  Return to clinic in 2 weeks for LOB (01/01/2018)  Anitra LauthAaron A Kameko Hukill, Student-PA

## 2017-12-18 NOTE — Patient Instructions (Signed)
Third Trimester of Pregnancy The third trimester is from week 28 through week 40 (months 7 through 9). The third trimester is a time when the unborn baby (fetus) is growing rapidly. At the end of the ninth month, the fetus is about 20 inches in length and weighs 6-10 pounds. Body changes during your third trimester Your body will continue to go through many changes during pregnancy. The changes vary from woman to woman. During the third trimester:  Your weight will continue to increase. You can expect to gain 25-35 pounds (11-16 kg) by the end of the pregnancy.  You may begin to get stretch marks on your hips, abdomen, and breasts.  You may urinate more often because the fetus is moving lower into your pelvis and pressing on your bladder.  You may develop or continue to have heartburn. This is caused by increased hormones that slow down muscles in the digestive tract.  You may develop or continue to have constipation because increased hormones slow digestion and cause the muscles that push waste through your intestines to relax.  You may develop hemorrhoids. These are swollen veins (varicose veins) in the rectum that can itch or be painful.  You may develop swollen, bulging veins (varicose veins) in your legs.  You may have increased body aches in the pelvis, back, or thighs. This is due to weight gain and increased hormones that are relaxing your joints.  You may have changes in your hair. These can include thickening of your hair, rapid growth, and changes in texture. Some women also have hair loss during or after pregnancy, or hair that feels dry or thin. Your hair will most likely return to normal after your baby is born.  Your breasts will continue to grow and they will continue to become tender. A yellow fluid (colostrum) may leak from your breasts. This is the first milk you are producing for your baby.  Your belly button may stick out.  You may notice more swelling in your hands,  face, or ankles.  You may have increased tingling or numbness in your hands, arms, and legs. The skin on your belly may also feel numb.  You may feel short of breath because of your expanding uterus.  You may have more problems sleeping. This can be caused by the size of your belly, increased need to urinate, and an increase in your body's metabolism.  You may notice the fetus "dropping," or moving lower in your abdomen (lightening).  You may have increased vaginal discharge.  You may notice your joints feel loose and you may have pain around your pelvic bone.  What to expect at prenatal visits You will have prenatal exams every 2 weeks until week 36. Then you will have weekly prenatal exams. During a routine prenatal visit:  You will be weighed to make sure you and the baby are growing normally.  Your blood pressure will be taken.  Your abdomen will be measured to track your baby's growth.  The fetal heartbeat will be listened to.  Any test results from the previous visit will be discussed.  You may have a cervical check near your due date to see if your cervix has softened or thinned (effaced).  You will be tested for Group B streptococcus. This happens between 35 and 37 weeks.  Your health care provider may ask you:  What your birth plan is.  How you are feeling.  If you are feeling the baby move.  If you have had   any abnormal symptoms, such as leaking fluid, bleeding, severe headaches, or abdominal cramping.  If you are using any tobacco products, including cigarettes, chewing tobacco, and electronic cigarettes.  If you have any questions.  Other tests or screenings that may be performed during your third trimester include:  Blood tests that check for low iron levels (anemia).  Fetal testing to check the health, activity level, and growth of the fetus. Testing is done if you have certain medical conditions or if there are problems during the  pregnancy.  Nonstress test (NST). This test checks the health of your baby to make sure there are no signs of problems, such as the baby not getting enough oxygen. During this test, a belt is placed around your belly. The baby is made to move, and its heart rate is monitored during movement.  What is false labor? False labor is a condition in which you feel small, irregular tightenings of the muscles in the womb (contractions) that usually go away with rest, changing position, or drinking water. These are called Braxton Hicks contractions. Contractions may last for hours, days, or even weeks before true labor sets in. If contractions come at regular intervals, become more frequent, increase in intensity, or become painful, you should see your health care provider. What are the signs of labor?  Abdominal cramps.  Regular contractions that start at 10 minutes apart and become stronger and more frequent with time.  Contractions that start on the top of the uterus and spread down to the lower abdomen and back.  Increased pelvic pressure and dull back pain.  A watery or bloody mucus discharge that comes from the vagina.  Leaking of amniotic fluid. This is also known as your "water breaking." It could be a slow trickle or a gush. Let your health care provider know if it has a color or strange odor. If you have any of these signs, call your health care provider right away, even if it is before your due date. Follow these instructions at home: Medicines  Follow your health care provider's instructions regarding medicine use. Specific medicines may be either safe or unsafe to take during pregnancy.  Take a prenatal vitamin that contains at least 600 micrograms (mcg) of folic acid.  If you develop constipation, try taking a stool softener if your health care provider approves. Eating and drinking  Eat a balanced diet that includes fresh fruits and vegetables, whole grains, good sources of protein  such as meat, eggs, or tofu, and low-fat dairy. Your health care provider will help you determine the amount of weight gain that is right for you.  Avoid raw meat and uncooked cheese. These carry germs that can cause birth defects in the baby.  If you have low calcium intake from food, talk to your health care provider about whether you should take a daily calcium supplement.  Eat four or five small meals rather than three large meals a day.  Limit foods that are high in fat and processed sugars, such as fried and sweet foods.  To prevent constipation: ? Drink enough fluid to keep your urine clear or pale yellow. ? Eat foods that are high in fiber, such as fresh fruits and vegetables, whole grains, and beans. Activity  Exercise only as directed by your health care provider. Most women can continue their usual exercise routine during pregnancy. Try to exercise for 30 minutes at least 5 days a week. Stop exercising if you experience uterine contractions.  Avoid heavy   lifting.  Do not exercise in extreme heat or humidity, or at high altitudes.  Wear low-heel, comfortable shoes.  Practice good posture.  You may continue to have sex unless your health care provider tells you otherwise. Relieving pain and discomfort  Take frequent breaks and rest with your legs elevated if you have leg cramps or low back pain.  Take warm sitz baths to soothe any pain or discomfort caused by hemorrhoids. Use hemorrhoid cream if your health care provider approves.  Wear a good support bra to prevent discomfort from breast tenderness.  If you develop varicose veins: ? Wear support pantyhose or compression stockings as told by your healthcare provider. ? Elevate your feet for 15 minutes, 3-4 times a day. Prenatal care  Write down your questions. Take them to your prenatal visits.  Keep all your prenatal visits as told by your health care provider. This is important. Safety  Wear your seat belt at  all times when driving.  Make a list of emergency phone numbers, including numbers for family, friends, the hospital, and police and fire departments. General instructions  Avoid cat litter boxes and soil used by cats. These carry germs that can cause birth defects in the baby. If you have a cat, ask someone to clean the litter box for you.  Do not travel far distances unless it is absolutely necessary and only with the approval of your health care provider.  Do not use hot tubs, steam rooms, or saunas.  Do not drink alcohol.  Do not use any products that contain nicotine or tobacco, such as cigarettes and e-cigarettes. If you need help quitting, ask your health care provider.  Do not use any medicinal herbs or unprescribed drugs. These chemicals affect the formation and growth of the baby.  Do not douche or use tampons or scented sanitary pads.  Do not cross your legs for long periods of time.  To prepare for the arrival of your baby: ? Take prenatal classes to understand, practice, and ask questions about labor and delivery. ? Make a trial run to the hospital. ? Visit the hospital and tour the maternity area. ? Arrange for maternity or paternity leave through employers. ? Arrange for family and friends to take care of pets while you are in the hospital. ? Purchase a rear-facing car seat and make sure you know how to install it in your car. ? Pack your hospital bag. ? Prepare the baby's nursery. Make sure to remove all pillows and stuffed animals from the baby's crib to prevent suffocation.  Visit your dentist if you have not gone during your pregnancy. Use a soft toothbrush to brush your teeth and be gentle when you floss. Contact a health care provider if:  You are unsure if you are in labor or if your water has broken.  You become dizzy.  You have mild pelvic cramps, pelvic pressure, or nagging pain in your abdominal area.  You have lower back pain.  You have persistent  nausea, vomiting, or diarrhea.  You have an unusual or bad smelling vaginal discharge.  You have pain when you urinate. Get help right away if:  Your water breaks before 37 weeks.  You have regular contractions less than 5 minutes apart before 37 weeks.  You have a fever.  You are leaking fluid from your vagina.  You have spotting or bleeding from your vagina.  You have severe abdominal pain or cramping.  You have rapid weight loss or weight gain.    You have shortness of breath with chest pain.  You notice sudden or extreme swelling of your face, hands, ankles, feet, or legs.  Your baby makes fewer than 10 movements in 2 hours.  You have severe headaches that do not go away when you take medicine.  You have vision changes. Summary  The third trimester is from week 28 through week 40, months 7 through 9. The third trimester is a time when the unborn baby (fetus) is growing rapidly.  During the third trimester, your discomfort may increase as you and your baby continue to gain weight. You may have abdominal, leg, and back pain, sleeping problems, and an increased need to urinate.  During the third trimester your breasts will keep growing and they will continue to become tender. A yellow fluid (colostrum) may leak from your breasts. This is the first milk you are producing for your baby.  False labor is a condition in which you feel small, irregular tightenings of the muscles in the womb (contractions) that eventually go away. These are called Braxton Hicks contractions. Contractions may last for hours, days, or even weeks before true labor sets in.  Signs of labor can include: abdominal cramps; regular contractions that start at 10 minutes apart and become stronger and more frequent with time; watery or bloody mucus discharge that comes from the vagina; increased pelvic pressure and dull back pain; and leaking of amniotic fluid. This information is not intended to replace advice  given to you by your health care provider. Make sure you discuss any questions you have with your health care provider. Document Released: 09/09/2001 Document Revised: 02/21/2016 Document Reviewed: 11/16/2012 Elsevier Interactive Patient Education  2017 [employer]  

## 2017-12-30 ENCOUNTER — Emergency Department (HOSPITAL_BASED_OUTPATIENT_CLINIC_OR_DEPARTMENT_OTHER)
Admission: EM | Admit: 2017-12-30 | Discharge: 2017-12-30 | Disposition: A | Discharge status: Home / Self Care | Payer: Medicaid Other | Attending: Emergency Medicine | Admitting: Emergency Medicine

## 2017-12-30 ENCOUNTER — Encounter (HOSPITAL_BASED_OUTPATIENT_CLINIC_OR_DEPARTMENT_OTHER): Payer: Self-pay

## 2017-12-30 ENCOUNTER — Other Ambulatory Visit: Payer: Self-pay

## 2017-12-30 DIAGNOSIS — M545 Low back pain, unspecified: Secondary | ICD-10-CM

## 2017-12-30 DIAGNOSIS — M62838 Other muscle spasm: Secondary | ICD-10-CM | POA: Insufficient documentation

## 2017-12-30 NOTE — ED Notes (Signed)
Pt verbalizes understanding of d/c instructions and denies any further needs at this time. 
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
## 2017-12-30 NOTE — Discharge Instructions (Signed)
Tylenol for pain.  Return for vaginal bleeding, abdominal pain.
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
## 2018-01-01 ENCOUNTER — Encounter: Payer: Self-pay | Admitting: Student

## 2018-01-14 ENCOUNTER — Ambulatory Visit (INDEPENDENT_AMBULATORY_CARE_PROVIDER_SITE_OTHER): Payer: Self-pay | Admitting: Medical

## 2018-01-14 ENCOUNTER — Encounter: Payer: Self-pay | Admitting: Medical

## 2018-01-14 VITALS — BP 109/61 | HR 102 | Wt 162.2 lb

## 2018-01-14 DIAGNOSIS — Z3480 Encounter for supervision of other normal pregnancy, unspecified trimester: Secondary | ICD-10-CM

## 2018-01-14 DIAGNOSIS — Z3483 Encounter for supervision of other normal pregnancy, third trimester: Secondary | ICD-10-CM

## 2018-01-14 NOTE — Progress Notes (Signed)
   PRENATAL VISIT NOTE  Subjective:  Jennifer Prince is a 22 y.o. G2P1001 at 2190w1d being seen today for ongoing prenatal care.  She is currently monitored for the following issues for this low-risk pregnancy and has Supervision of other normal pregnancy, antepartum on their problem list.  Patient reports no complaints.  Contractions: Not present. Vag. Bleeding: None.  Movement: Present. Denies leaking of fluid.   The following portions of the patient's history were reviewed and updated as appropriate: allergies, current medications, past family history, past medical history, past social history, past surgical history and problem list. Problem list updated.  Objective:   Vitals:   01/14/18 0817  BP: 109/61  Pulse: (!) 102  Weight: 162 lb 3.2 oz (73.6 kg)    Fetal Status: Fetal Heart Rate (bpm): 152 Fundal Height: 34 cm Movement: Present     General:  Alert, oriented and cooperative. Patient is in no acute distress.  Skin: Skin is warm and dry. No rash noted.   Cardiovascular: Normal heart rate noted  Respiratory: Normal respiratory effort, no problems with respiration noted  Abdomen: Soft, gravid, appropriate for gestational age.  Pain/Pressure: Absent     Pelvic: Cervical exam deferred        Extremities: Normal range of motion.  Edema: None  Mental Status: Normal mood and affect. Normal behavior. Normal judgment and thought content.   Assessment and Plan:  Pregnancy: G2P1001 at 2190w1d  1. Supervision of other normal pregnancy, antepartum - Doing well, no complaints - Discussed GBS and GC/Chlamydia at next visit   Preterm labor symptoms and general obstetric precautions including but not limited to vaginal bleeding, contractions, leaking of fluid and fetal movement were reviewed in detail with the patient. Please refer to After Visit Summary for other counseling recommendations.  Return in about 2 weeks (around 01/28/2018) for LOB.  Vonzella NippleJulie Travontae Freiberger, PA-C

## 2018-01-14 NOTE — Patient Instructions (Signed)

## 2018-01-28 ENCOUNTER — Ambulatory Visit (INDEPENDENT_AMBULATORY_CARE_PROVIDER_SITE_OTHER): Payer: Self-pay | Admitting: Student

## 2018-01-28 ENCOUNTER — Other Ambulatory Visit (HOSPITAL_COMMUNITY)
Admission: RE | Admit: 2018-01-28 | Discharge: 2018-01-28 | Disposition: A | Discharge status: Home / Self Care | Payer: Medicaid Other | Source: Ambulatory Visit | Attending: Obstetrics and Gynecology | Admitting: Obstetrics and Gynecology

## 2018-01-28 VITALS — BP 119/64 | HR 86 | Wt 168.1 lb

## 2018-01-28 DIAGNOSIS — Z3480 Encounter for supervision of other normal pregnancy, unspecified trimester: Secondary | ICD-10-CM

## 2018-01-28 DIAGNOSIS — Z3483 Encounter for supervision of other normal pregnancy, third trimester: Secondary | ICD-10-CM

## 2018-01-28 NOTE — Progress Notes (Signed)
   PRENATAL VISIT NOTE  Subjective:  Jennifer Prince is a 22 y.o. G2P1001 at [redacted]w[redacted]d being seen today for ongoing prenatal care.  She is currently monitored for the following issues for this low-risk pregnancy and has Supervision of other normal pregnancy, antepartum on their problem list.  Patient reports no complaints.  Contractions: Not present. Vag. Bleeding: None.  Movement: Present. Denies leaking of fluid.   The following portions of the patient's history were reviewed and updated as appropriate: allergies, current medications, past family history, past medical history, past social history, past surgical history and problem list. Problem list updated.  Objective:   Vitals:   01/28/18 1134  BP: 119/64  Pulse: 86  Weight: 168 lb 1.6 oz (76.2 kg)    Fetal Status: Fetal Heart Rate (bpm): 147 Fundal Height: 35 cm Movement: Present  Presentation: Vertex  General:  Alert, oriented and cooperative. Patient is in no acute distress.  Skin: Skin is warm and dry. No rash noted.   Cardiovascular: Normal heart rate noted  Respiratory: Normal respiratory effort, no problems with respiration noted  Abdomen: Soft, gravid, appropriate for gestational age.  Pain/Pressure: Absent     Pelvic: Cervical exam performed Dilation: 1 Effacement (%): Thick Station: -3  Extremities: Normal range of motion.  Edema: None  Mental Status: Normal mood and affect. Normal behavior. Normal judgment and thought content.   Assessment and Plan:  Pregnancy: G2P1001 at [redacted]w[redacted]d  1. Supervision of other normal pregnancy, antepartum -doing well -Discussed contraception. Doesn't want to use birth control. Discussed use of condoms to avoid short intervals between pregnancy - Culture, beta strep (group b only) - GC/Chlamydia probe amp ([place])not at [facility]  Term labor symptoms and general obstetric precautions including but not limited to vaginal bleeding, contractions, leaking of fluid and fetal movement were reviewed in  detail with the patient. Please refer to After Visit Summary for other counseling recommendations.  Return in about 1 week (around 02/04/2018) for Routine OB.  Future Appointments  Date Time Provider Department Center  02/04/2018  9:15 AM Kathlene Cote [facility]  02/11/2018  1:15 PM Judeth Horn, NP [facility]  02/18/2018  9:55 AM Judeth Horn, NP [facility]    Judeth Horn, NP

## 2018-01-28 NOTE — Patient Instructions (Addendum)
Childbirth Education Options: [facility] Classes:  Childbirth education classes can help you get ready for a positive parenting experience. You can also meet other expectant parents and get free stuff for your baby. Each class runs for five weeks on the same night and costs $45 for the mother-to-be and her support person. Medicaid covers the cost if you are eligible. Call [phone] to register. [facility] Childbirth Education:  [phone] or [phone] or [email or web address]  Baby & Me Class: Discuss newborn & infant parenting and family adjustment issues with other new mothers in a relaxed environment. Each week brings a new speaker or baby-centered activity. We encourage new mothers to join us every Thursday at 11:00am. Babies birth until crawling. No registration or fee. Daddy Boot Camp: This course offers Dads-to-be the tools and knowledge needed to feel confident on their journey to becoming new fathers. Experienced dads, who have been trained as coaches, teach dads-to-be how to hold, comfort, diaper, swaddle and play with their infant while being able to support the new mom as well. A class for men taught by men. $25/dad Big Brother/Big Sister: Let your children share in the joy of a new brother or sister in this special class designed just for them. Class includes discussion about how families care for babies: swaddling, holding, diapering, safety as well as how they can be helpful in their new role. This class is designed for children ages 2 to 6, but any age is welcome. Please register each child individually. $5/child  Mom Talk: This mom-led group offers support and connection to mothers as they journey through the adjustments and struggles of that sometimes overwhelming first year after the birth of a child. Tuesdays at 10:00am and Thursdays at 6:00pm. Babies welcome. No registration or fee. Breastfeeding Support Group: This group is a mother-to-mother  support circle where moms have the opportunity to share their breastfeeding experiences. A Lactation Consultant is present for questions and concerns. Meets each Tuesday at 11:00am. No fee or registration. Breastfeeding Your Baby: Learn what to expect in the first days of breastfeeding your newborn.  This class will help you feel more confident with the skills needed to begin your breastfeeding experience. Many new mothers are concerned about breastfeeding after leaving the hospital. This class will also address the most common fears and challenges about breastfeeding during the first few weeks, months and beyond. (call for fee) Comfort Techniques and Tour: This 2 hour interactive class will provide you the opportunity to learn & practice hands-on techniques that can help relieve some of the discomfort of labor and encourage your baby to rotate toward the best position for birth. You and your partner will be able to try a variety of labor positions with birth balls and rebozos as well as practice breathing, relaxation, and visualization techniques. A tour of the [facility] is included with this class. $20 per registrant and support person Childbirth Class- Weekend Option: This class is a Weekend version of our Birth & Baby series. It is designed for parents who have a difficult time fitting several weeks of classes into their schedule. It covers the care of your newborn and the basics of labor and childbirth. It also includes a Maternity Care Center Tour of [facility] and lunch. The class is held two consecutive days: beginning on Friday evening from 6:30 - 8:30 p.m. and the next day, Saturday from 9 a.m. - 4 p.m. (call for fee) Waterbirth Class: Interested in a waterbirth?  This   informational class will help you discover whether waterbirth is the right fit for you. Education about waterbirth itself, supplies you would need and how to assemble your support team is what you can  expect from this class. Some obstetrical practices require this class in order to pursue a waterbirth. (Not all obstetrical practices offer waterbirth-check with your healthcare provider.) Register only the expectant mom, but you are encouraged to bring your partner to class! Required if planning waterbirth, no fee. Infant/Child CPR: Parents, grandparents, babysitters, and friends learn Cardio-Pulmonary Resuscitation skills for infants and children. You will also learn how to treat both conscious and unconscious choking in infants and children. This Family & Friends program does not offer certification. Register each participant individually to ensure that enough mannequins are available. (Call for fee) Grandparent Love: Expecting a grandbaby? This class is for you! Learn about the latest infant care and safety recommendations and ways to support your own child as he or she transitions into the parenting role. Taught by Registered Nurses who are childbirth instructors, but most importantly...they are grandmothers too! $10/person. Childbirth Class- Natural Childbirth: This series of 5 weekly classes is for expectant parents who want to learn and practice natural methods of coping with the process of labor and childbirth. Relaxation, breathing, massage, visualization, role of the partner, and helpful positioning are highlighted. Participants learn how to be confident in their body's ability to give birth. This class will empower and help parents make informed decisions about their own care. Includes discussion that will help new parents transition into the immediate postpartum period. Maternity Care Center Tour of [facility] is included. We suggest taking this class between 25-32 weeks, but it's only a recommendation. $75 per registrant and one support person or $30 Medicaid. Childbirth Class- 3 week Series: This option of 3 weekly classes helps you and your labor partner prepare for childbirth. Newborn  care, labor & birth, cesarean birth, pain management, and comfort techniques are discussed and a Maternity Care Center Tour of [facility] is included. The class meets at the same time, on the same day of the week for 3 consecutive weeks beginning with the starting date you choose. $60 for registrant and one support person.  Marvelous Multiples: Expecting twins, triplets, or more? This class covers the differences in labor, birth, parenting, and breastfeeding issues that face multiples' parents. NICU tour is included. Led by a Certified Childbirth Educator who is the mother of twins. No fee. Caring for Baby: This class is for expectant and adoptive parents who want to learn and practice the most up-to-date newborn care for their babies. Focus is on birth through the first six weeks of life. Topics include feeding, bathing, diapering, crying, umbilical cord care, circumcision care and safe sleep. Parents learn to recognize symptoms of illness and when to call the pediatrician. Register only the mom-to-be and your partner or support person can plan to come with you! $10 per registrant and support person Childbirth Class- online option: This online class offers you the freedom to complete a Birth and Baby series in the comfort of your own home. The flexibility of this option allows you to review sections at your own pace, at times convenient to you and your support people. It includes additional video information, animations, quizzes, and extended activities. Get organized with helpful eClass tools, checklists, and trackers. Once you register online for the class, you will receive an email within a few days to accept the invitation and begin the class when the time   is right for you. The content will be available to you for 60 days. $60 for 60 days of online access for you and your support people.  Local Doulas: Natural Baby Doulas [email or web address] Tel:  [phone] [email or web address] [place] [phone] [email or web address] The [employer] Ladies  (also do waterbirth tub rental) [phone] [email or web address] [facility] [employer] [phone] [email or web address] [employer]hyth[employer]  [phone] [email or web address] [place] [employer] (PADA) [email or web address] [email or web address].org/index.htm [place] [facility]  [email or web address] Considering Waterbirth? Guide for patients at [facility]  Why consider waterbirth?  . Gentle birth for babies . Less pain medicine used in labor . May allow for passive descent/less pushing . May reduce perineal tears  . More mobility and instinctive maternal position changes . Increased maternal relaxation . Reduced blood pressure in labor  Is waterbirth safe? What are the risks of infection, drowning or other complications?  . Infection: o Very low risk (3.7 % for tub vs 4.8% for bed) o 7 in 8000 waterbirths with documented infection o Poorly cleaned equipment most common cause o Slightly lower group B strep transmission rate  . Drowning o Maternal:  - Very low risk   - Related to seizures or fainting o Newborn:  - Very low risk. No evidence of increased risk of respiratory problems in multiple large studies - Physiological protection from breathing under water - Avoid underwater birth if there are any fetal complications - Once baby's head is out of the water, keep it out.  . Birth complication o Some reports of cord trauma, but risk decreased by bringing baby to surface gradually o No evidence of increased risk of shoulder dystocia. Mothers can usually change positions faster in water than in a bed, possibly aiding the maneuvers to free the shoulder.   You must attend a Waterbirth class at [facility]  3rd Wednesday of every month from 7-9pm  Free  Register by calling [phone] or online at [email or web address]  Bring us the certificate from the class to your prenatal appointment  Meet with a midwife at 36 weeks to see if you can still plan a waterbirth and to sign the consent.   Purchase or rent the following supplies:   Water Birth Pool (Birth Pool in a Box or [place] for instance)  (Tubs start ~$125)  Single-use disposable tub liner designed for your brand of tub  New garden hose labeled "lead-free", "suitable for drinking water",  Electric drain pump to remove water (We recommend 792 gallon per hour or greater pump.)   Separate garden hose to remove the dirty water  Fish net  Bathing suit top (optional)  Long-handled mirror (optional)  Places to purchase or rent supplies  [email or web address] for tub purchases and supplies  Waterbirthsolutions.com for tub purchases and supplies  The [employer] Ladies ([email or web address]) $275 for tub rental/set-up & take down/kit   [place] [employer] (http://www.padanc.org/MeetUs.htm) Information regarding doulas (labor support) who provide pool rentals  Our practice has a Birth Pool in a Box tub at the hospital that you may borrow on a first-come-first-served basis. It is your responsibility to to set up, clean and break down the tub. We cannot guarantee the availability of this tub in advance. You are responsible for bringing all accessories listed above. If you do not have all necessary supplies you cannot have a waterbirth.    Things that would prevent you from having a waterbirth:  Premature, <37wks  Previous cesarean birth  Presence of thick meconium-stained fluid  Multiple gestation (Twins,   triplets, etc.)  Uncontrolled diabetes or gestational diabetes requiring medication  Hypertension requiring medication or diagnosis of pre-eclampsia  Heavy vaginal bleeding  Non-reassuring fetal  heart rate  Active infection (MRSA, etc.). Group B Strep is NOT a contraindication for  waterbirth.  If your labor has to be induced and induction method requires continuous  monitoring of the baby's heart rate  Other risks/issues identified by your obstetrical provider  Please remember that birth is unpredictable. Under certain unforeseeable circumstances your provider may advise against giving birth in the tub. These decisions will be made on a case-by-case basis and with the safety of you and your baby as our highest priority.      

## 2018-01-29 LAB — GC/CHLAMYDIA PROBE AMP (~~LOC~~) NOT AT ARMC
Chlamydia: NEGATIVE
NEISSERIA GONORRHEA: NEGATIVE

## 2018-01-30 ENCOUNTER — Encounter (HOSPITAL_COMMUNITY): Payer: Self-pay | Admitting: *Deleted

## 2018-01-30 ENCOUNTER — Inpatient Hospital Stay (HOSPITAL_COMMUNITY)
Admission: AD | Admit: 2018-01-30 | Discharge: 2018-02-02 | DRG: 805 | Disposition: A | Discharge status: Home / Self Care | Payer: Medicaid Other | Source: Ambulatory Visit | Attending: Obstetrics & Gynecology | Admitting: Obstetrics & Gynecology

## 2018-01-30 DIAGNOSIS — O42013 Preterm premature rupture of membranes, onset of labor within 24 hours of rupture, third trimester: Secondary | ICD-10-CM | POA: Diagnosis not present

## 2018-01-30 DIAGNOSIS — O42919 Preterm premature rupture of membranes, unspecified as to length of time between rupture and onset of labor, unspecified trimester: Secondary | ICD-10-CM

## 2018-01-30 DIAGNOSIS — Z3A36 36 weeks gestation of pregnancy: Secondary | ICD-10-CM

## 2018-01-30 DIAGNOSIS — F17200 Nicotine dependence, unspecified, uncomplicated: Secondary | ICD-10-CM | POA: Diagnosis present

## 2018-01-30 DIAGNOSIS — O9902 Anemia complicating childbirth: Secondary | ICD-10-CM | POA: Diagnosis present

## 2018-01-30 DIAGNOSIS — O99334 Smoking (tobacco) complicating childbirth: Secondary | ICD-10-CM | POA: Diagnosis present

## 2018-01-30 DIAGNOSIS — D649 Anemia, unspecified: Secondary | ICD-10-CM | POA: Diagnosis present

## 2018-01-30 DIAGNOSIS — O42913 Preterm premature rupture of membranes, unspecified as to length of time between rupture and onset of labor, third trimester: Secondary | ICD-10-CM | POA: Diagnosis present

## 2018-01-30 DIAGNOSIS — Z3480 Encounter for supervision of other normal pregnancy, unspecified trimester: Secondary | ICD-10-CM

## 2018-01-30 LAB — CBC
HCT: 31.4 % — ABNORMAL LOW (ref 36.0–46.0)
Hemoglobin: 10.7 g/dL — ABNORMAL LOW (ref 12.0–15.0)
MCH: 31.6 pg (ref 26.0–34.0)
MCHC: 34.1 g/dL (ref 30.0–36.0)
MCV: 92.6 fL (ref 78.0–100.0)
Platelets: 210 10*3/uL (ref 150–400)
RBC: 3.39 MIL/uL — AB (ref 3.87–5.11)
RDW: 14.5 % (ref 11.5–15.5)
WBC: 13.1 10*3/uL — AB (ref 4.0–10.5)

## 2018-01-30 LAB — POCT FERN TEST: POCT FERN TEST: POSITIVE

## 2018-01-30 LAB — OB RESULTS CONSOLE GBS: STREP GROUP B AG: POSITIVE

## 2018-01-30 MED ORDER — SOD CITRATE-CITRIC ACID 500-334 MG/5ML PO SOLN: 30.0000 mL | ORAL | Status: DC | PRN

## 2018-01-30 MED ORDER — LACTATED RINGERS IV SOLN
INTRAVENOUS | Status: DC
  Administered 2018-01-31 (×2): via INTRAVENOUS

## 2018-01-30 MED ORDER — ONDANSETRON HCL 4 MG/2ML IJ SOLN: 4.0000 mg | Freq: Four times a day (QID) | INTRAMUSCULAR | Status: DC | PRN

## 2018-01-30 MED ORDER — LIDOCAINE HCL (PF) 1 % IJ SOLN
30.0000 mL | INTRAMUSCULAR | Status: DC | PRN
  Filled 2018-01-30: qty 30

## 2018-01-30 MED ORDER — OXYTOCIN 40 UNITS IN LACTATED RINGERS INFUSION - SIMPLE MED: 2.5000 [IU]/h | INTRAVENOUS | Status: DC

## 2018-01-30 MED ORDER — OXYCODONE-ACETAMINOPHEN 5-325 MG PO TABS: 1.0000 | ORAL_TABLET | ORAL | Status: DC | PRN

## 2018-01-30 MED ORDER — OXYTOCIN BOLUS FROM INFUSION
500.0000 mL | Freq: Once | INTRAVENOUS | Status: AC
  Administered 2018-01-31: 500 mL via INTRAVENOUS

## 2018-01-30 MED ORDER — ACETAMINOPHEN 325 MG PO TABS: 650.0000 mg | ORAL_TABLET | ORAL | Status: DC | PRN

## 2018-01-30 MED ORDER — FLEET ENEMA 7-19 GM/118ML RE ENEM: 1.0000 | ENEMA | RECTAL | Status: DC | PRN

## 2018-01-30 MED ORDER — OXYCODONE-ACETAMINOPHEN 5-325 MG PO TABS: 2.0000 | ORAL_TABLET | ORAL | Status: DC | PRN

## 2018-01-30 MED ORDER — LACTATED RINGERS IV SOLN
500.0000 mL | INTRAVENOUS | Status: DC | PRN
  Administered 2018-01-30: 1000 mL via INTRAVENOUS

## 2018-01-30 NOTE — MAU Note (Signed)
Leaking clear fld since 2000. Having ctxs after leaking fld started. 2cm last sve.

## 2018-01-30 NOTE — Progress Notes (Signed)
Wynelle Bourgeois CNM notified of pt's admission and status. Aware of ctx pattern, SROM at 2000, unknown gbs. Will admit to [facility]

## 2018-01-31 ENCOUNTER — Inpatient Hospital Stay (HOSPITAL_COMMUNITY): Payer: Medicaid Other | Admitting: Anesthesiology

## 2018-01-31 ENCOUNTER — Other Ambulatory Visit: Payer: Self-pay

## 2018-01-31 ENCOUNTER — Encounter (HOSPITAL_COMMUNITY): Payer: Self-pay | Admitting: Advanced Practice Midwife

## 2018-01-31 DIAGNOSIS — O42013 Preterm premature rupture of membranes, onset of labor within 24 hours of rupture, third trimester: Secondary | ICD-10-CM

## 2018-01-31 DIAGNOSIS — Z3A36 36 weeks gestation of pregnancy: Secondary | ICD-10-CM

## 2018-01-31 DIAGNOSIS — O42919 Preterm premature rupture of membranes, unspecified as to length of time between rupture and onset of labor, unspecified trimester: Secondary | ICD-10-CM | POA: Insufficient documentation

## 2018-01-31 LAB — TYPE AND SCREEN
ABO/RH(D): A POS
Antibody Screen: NEGATIVE

## 2018-01-31 LAB — RPR: RPR Ser Ql: NONREACTIVE

## 2018-01-31 LAB — GROUP B STREP BY PCR: Group B strep by PCR: POSITIVE — AB

## 2018-01-31 MED ORDER — ACETAMINOPHEN 325 MG PO TABS: 650.0000 mg | ORAL_TABLET | ORAL | Status: DC | PRN

## 2018-01-31 MED ORDER — PENICILLIN G POTASSIUM 5000000 UNITS IJ SOLR
2.5000 10*6.[IU] | INTRAVENOUS | Status: DC
  Administered 2018-01-31 (×2): 2.5 10*6.[IU] via INTRAVENOUS
  Filled 2018-01-31 (×7): qty 2.5

## 2018-01-31 MED ORDER — EPHEDRINE 5 MG/ML INJ
10.0000 mg | INTRAVENOUS | Status: DC | PRN
Start: 2018-01-31 — End: 2018-01-31
  Filled 2018-01-31: qty 2

## 2018-01-31 MED ORDER — SENNOSIDES-DOCUSATE SODIUM 8.6-50 MG PO TABS
2.0000 | ORAL_TABLET | ORAL | Status: DC
  Administered 2018-01-31: 2 via ORAL
  Filled 2018-01-31: qty 2

## 2018-01-31 MED ORDER — WITCH HAZEL-GLYCERIN EX PADS: 1.0000 "application " | MEDICATED_PAD | CUTANEOUS | Status: DC | PRN

## 2018-01-31 MED ORDER — ONDANSETRON HCL 4 MG PO TABS: 4.0000 mg | ORAL_TABLET | ORAL | Status: DC | PRN

## 2018-01-31 MED ORDER — TERBUTALINE SULFATE 1 MG/ML IJ SOLN
0.2500 mg | Freq: Once | INTRAMUSCULAR | Status: DC | PRN
  Filled 2018-01-31: qty 1

## 2018-01-31 MED ORDER — OXYTOCIN 40 UNITS IN LACTATED RINGERS INFUSION - SIMPLE MED
1.0000 m[IU]/min | INTRAVENOUS | Status: DC
  Administered 2018-01-31: 2 m[IU]/min via INTRAVENOUS
  Filled 2018-01-31: qty 1000

## 2018-01-31 MED ORDER — DIPHENHYDRAMINE HCL 25 MG PO CAPS: 25.0000 mg | ORAL_CAPSULE | Freq: Four times a day (QID) | ORAL | Status: DC | PRN

## 2018-01-31 MED ORDER — SODIUM CHLORIDE 0.9 % IV SOLN
5.0000 10*6.[IU] | Freq: Once | INTRAVENOUS | Status: AC
  Administered 2018-01-31: 5 10*6.[IU] via INTRAVENOUS
  Filled 2018-01-31: qty 5

## 2018-01-31 MED ORDER — PRENATAL MULTIVITAMIN CH
1.0000 | ORAL_TABLET | Freq: Every day | ORAL | Status: DC
  Administered 2018-02-01 – 2018-02-02 (×2): 1 via ORAL
  Filled 2018-01-31 (×2): qty 1

## 2018-01-31 MED ORDER — ZOLPIDEM TARTRATE 5 MG PO TABS: 5.0000 mg | ORAL_TABLET | Freq: Every evening | ORAL | Status: DC | PRN

## 2018-01-31 MED ORDER — LIDOCAINE HCL (PF) 1 % IJ SOLN
INTRAMUSCULAR | Status: DC | PRN
  Administered 2018-01-31: 6 mL via EPIDURAL
  Administered 2018-01-31: 4 mL via EPIDURAL

## 2018-01-31 MED ORDER — DIPHENHYDRAMINE HCL 50 MG/ML IJ SOLN: 12.5000 mg | INTRAMUSCULAR | Status: DC | PRN

## 2018-01-31 MED ORDER — LACTATED RINGERS IV SOLN
500.0000 mL | Freq: Once | INTRAVENOUS | Status: AC
  Administered 2018-01-31: 500 mL via INTRAVENOUS

## 2018-01-31 MED ORDER — PHENYLEPHRINE 40 MCG/ML (10ML) SYRINGE FOR IV PUSH (FOR BLOOD PRESSURE SUPPORT)
80.0000 ug | PREFILLED_SYRINGE | INTRAVENOUS | Status: DC | PRN
  Filled 2018-01-31: qty 5
  Filled 2018-01-31: qty 10

## 2018-01-31 MED ORDER — IBUPROFEN 600 MG PO TABS
600.0000 mg | ORAL_TABLET | Freq: Four times a day (QID) | ORAL | Status: DC
  Administered 2018-01-31 – 2018-02-02 (×8): 600 mg via ORAL
  Filled 2018-01-31 (×8): qty 1

## 2018-01-31 MED ORDER — DIBUCAINE 1 % RE OINT: 1.0000 "application " | TOPICAL_OINTMENT | RECTAL | Status: DC | PRN

## 2018-01-31 MED ORDER — COCONUT OIL OIL: 1.0000 "application " | TOPICAL_OIL | Status: DC | PRN

## 2018-01-31 MED ORDER — FENTANYL 2.5 MCG/ML BUPIVACAINE 1/10 % EPIDURAL INFUSION (WH - ANES)
14.0000 mL/h | INTRAMUSCULAR | Status: DC | PRN
  Administered 2018-01-31: 16.5 mL/h via EPIDURAL
  Administered 2018-01-31: 14 mL/h via EPIDURAL
  Filled 2018-01-31 (×2): qty 100

## 2018-01-31 MED ORDER — TETANUS-DIPHTH-ACELL PERTUSSIS 5-2.5-18.5 LF-MCG/0.5 IM SUSP: 0.5000 mL | Freq: Once | INTRAMUSCULAR | Status: DC

## 2018-01-31 MED ORDER — PHENYLEPHRINE 40 MCG/ML (10ML) SYRINGE FOR IV PUSH (FOR BLOOD PRESSURE SUPPORT)
80.0000 ug | PREFILLED_SYRINGE | INTRAVENOUS | Status: DC | PRN
Start: 2018-01-31 — End: 2018-01-31
  Filled 2018-01-31: qty 5

## 2018-01-31 MED ORDER — BENZOCAINE-MENTHOL 20-0.5 % EX AERO: 1.0000 "application " | INHALATION_SPRAY | CUTANEOUS | Status: DC | PRN

## 2018-01-31 MED ORDER — FENTANYL CITRATE (PF) 100 MCG/2ML IJ SOLN
100.0000 ug | INTRAMUSCULAR | Status: DC | PRN
  Administered 2018-01-31: 100 ug via INTRAVENOUS
  Filled 2018-01-31: qty 2

## 2018-01-31 MED ORDER — SIMETHICONE 80 MG PO CHEW: 80.0000 mg | CHEWABLE_TABLET | ORAL | Status: DC | PRN

## 2018-01-31 MED ORDER — ONDANSETRON HCL 4 MG/2ML IJ SOLN: 4.0000 mg | INTRAMUSCULAR | Status: DC | PRN

## 2018-01-31 MED ORDER — BETAMETHASONE SOD PHOS & ACET 6 (3-3) MG/ML IJ SUSP
12.0000 mg | Freq: Once | INTRAMUSCULAR | Status: AC
  Administered 2018-01-31: 12 mg via INTRAMUSCULAR
  Filled 2018-01-31: qty 2

## 2018-01-31 MED ORDER — LACTATED RINGERS IV SOLN: 500.0000 mL | Freq: Once | INTRAVENOUS | Status: DC

## 2018-01-31 MED ORDER — EPHEDRINE 5 MG/ML INJ
10.0000 mg | INTRAVENOUS | Status: DC | PRN
  Filled 2018-01-31: qty 2

## 2018-01-31 NOTE — Anesthesia Preprocedure Evaluation (Addendum)
Anesthesia Evaluation  Patient identified by MRN, date of birth, ID band Patient awake    Reviewed: Allergy & Precautions, Patient's Chart, lab work & pertinent test results  Airway Mallampati: II  TM Distance: >3 FB Neck ROM: Full    Dental no notable dental hx. (+) Teeth Intact   Pulmonary Current Smoker,    breath sounds clear to auscultation       Cardiovascular negative cardio ROS Normal cardiovascular exam Rhythm:Regular Rate:Normal     Neuro/Psych negative neurological ROS  negative psych ROS   GI/Hepatic negative GI ROS, Neg liver ROS,   Endo/Other  negative endocrine ROS  Renal/GU negative Renal ROS  negative genitourinary   Musculoskeletal negative musculoskeletal ROS (+)   Abdominal   Peds  Hematology  (+) anemia ,   Anesthesia Other Findings   Reproductive/Obstetrics (+) Pregnancy                            Anesthesia Physical Anesthesia Plan  ASA: II  Anesthesia Plan: Epidural   Post-op Pain Management:    Induction:   PONV Risk Score and Plan:   Airway Management Planned: Natural Airway  Additional Equipment:   Intra-op Plan:   Post-operative Plan:   Informed Consent: I have reviewed the patients History and Physical, chart, labs and discussed the procedure including the risks, benefits and alternatives for the proposed anesthesia with the patient or authorized representative who has indicated his/her understanding and acceptance.     Plan Discussed with: Anesthesiologist  Anesthesia Plan Comments:         Anesthesia Quick Evaluation

## 2018-01-31 NOTE — Progress Notes (Signed)
Subjective: Doing well with no issues. Pain well controlled with epidural.   Objective: BP 114/68   Pulse 83   Temp 97.9 F (36.6 C) (Oral)   Resp 18   Ht  (1.905 m)   Wt 76.2 kg (168 lb)   LMP 05/16/2017   SpO2 99%   BMI 21.00 kg/m  No intake/output data recorded. No intake/output data recorded.  FHT:  FHR: 135 bpm, variability: moderate,  accelerations:  Present,  decelerations:  Absent UC:   regular, every 8 minutes SVE:   Dilation: 6 Effacement (%): 90 Station: -2, -1 Exam by:: degele  Labs: Lab Results  Component Value Date   WBC 13.1 (H) 01/30/2018   HGB 10.7 (L) 01/30/2018   HCT 31.4 (L) 01/30/2018   MCV 92.6 01/30/2018   PLT 210 01/30/2018    Assessment / Plan: PPROM. Has received Betamethasone x1. AROM @ 1030. If contractions fail to become more regular will start pitocin.  Labor: S/P AROM, if contractions fail to become more regular will start pit Preeclampsia:  N/A Fetal Wellbeing:  Category I Pain Control:  Epidural I/D:  PCN ppx Anticipated MOD:  NSVD  Myrene Buddy 01/31/2018, 10:31 AM

## 2018-01-31 NOTE — Anesthesia Procedure Notes (Signed)
Epidural Patient location during procedure: OB Start time: 01/31/2018 4:29 AM  Staffing Anesthesiologist: Mal Amabile, MD Performed: anesthesiologist   Preanesthetic Checklist Completed: patient identified, site marked, surgical consent, pre-op evaluation, timeout performed, IV checked, risks and benefits discussed and monitors and equipment checked  Epidural Patient position: sitting Prep: site prepped and draped and DuraPrep Patient monitoring: continuous pulse ox and blood pressure Approach: midline Location: L3-L4 Injection technique: LOR air  Needle:  Needle type: Tuohy  Needle gauge: 17 G Needle length: 9 cm and 9 Needle insertion depth: 6 cm Catheter type: closed end flexible Catheter size: 19 Gauge Catheter at skin depth: 11 cm Test dose: negative and Other  Assessment Events: blood not aspirated, injection not painful, no injection resistance, negative IV test and no paresthesia  Additional Notes Patient identified. Risks and benefits discussed including failed block, incomplete  Pain control, post dural puncture headache, nerve damage, paralysis, blood pressure Changes, nausea, vomiting, reactions to medications-both toxic and allergic and post Partum back pain. All questions were answered. Patient expressed understanding and wished to proceed. Sterile technique was used throughout procedure. Epidural site was Dressed with sterile barrier dressing. No paresthesias, signs of intravascular injection Or signs of intrathecal spread were encountered.  Patient was more comfortable after the epidural was dosed. Please see RN's note for documentation of vital signs and FHR which are stable.

## 2018-01-31 NOTE — Anesthesia Postprocedure Evaluation (Signed)
Anesthesia Post Note  Patient: Jennifer Prince  Procedure(s) Performed: AN AD HOC LABOR EPIDURAL     Patient location during evaluation: Mother Baby Anesthesia Type: Epidural Level of consciousness: awake and alert, oriented and patient cooperative Pain management: pain level controlled Vital Signs Assessment: post-procedure vital signs reviewed and stable Respiratory status: spontaneous breathing Cardiovascular status: stable Postop Assessment: no headache, epidural receding, patient able to bend at knees and no signs of nausea or vomiting Anesthetic complications: no Comments: Pain score 0.    Last Vitals:  Vitals:   01/31/18 1555 01/31/18 1658  BP: 123/71 135/76  Pulse: 83 80  Resp: 18 18  Temp:  37 C  SpO2: 100% 100%    Last Pain:  Vitals:   01/31/18 1659  TempSrc:   PainSc: 0-No pain   Pain Goal: Patients Stated Pain Goal: 0 (01/30/18 2250)               Merrilyn Puma

## 2018-01-31 NOTE — Progress Notes (Signed)
Pt stated that she wanted to breastfeed after stating she wanted to formula feed; she fed her 1st child for 2.5 weeks and quit bc he started biting/became uncomfortable. This RN discussed behavior of late preterm infant & started pt with DEBP

## 2018-01-31 NOTE — H&P (Signed)
Jennifer Prince is a 22 y.o. female presenting for leaking of fluid since 2000hrs.  Only a few mild contractions.  Last labor was fast, only 2 hrs.   . OB History    Gravida  2   Para  1   Term  1   Preterm  0   AB  0   Living  1     SAB  0   TAB  0   Ectopic  0   Multiple  0   Live Births  1          Past Medical History:  Diagnosis Date  . Medical history non-contributory    Past Surgical History:  Procedure Laterality Date  . NO PAST SURGERIES     Family History: family history is not on file. Social History:  reports that she has been smoking.  She uses smokeless tobacco. She reports that she does not drink alcohol or use drugs.     Maternal Diabetes: No Genetic Screening: Normal Maternal Ultrasounds/Referrals: Normal Fetal Ultrasounds or other Referrals:  None Maternal Substance Abuse:  No Significant Maternal Medications:  None Significant Maternal Lab Results:  None Other Comments:  GBS done in office, pending.  PCR sent. Will give Betamethasone  Review of Systems  Constitutional: Negative for chills and fever.  Eyes: Negative for blurred vision and double vision.  Respiratory: Negative for shortness of breath.   Cardiovascular: Negative for chest pain.  Gastrointestinal: Positive for abdominal pain. Negative for constipation, diarrhea, nausea and vomiting.   Maternal Medical History:  Reason for admission: Rupture of membranes.  Nausea.  Contractions: Onset was 3-5 hours ago.   Frequency: irregular.   Perceived severity is mild.    Fetal activity: Perceived fetal activity is normal.   Last perceived fetal movement was within the past hour.    Prenatal complications: No bleeding, PIH, infection or pre-eclampsia.   Prenatal Complications - Diabetes: none.      Blood pressure (!) 105/53, pulse 98, temperature 98.5 F (36.9 C), resp. rate 18, height  (1.905 m), weight 168 lb (76.2 kg), last menstrual period 05/16/2017, unknown if  currently breastfeeding. Maternal Exam:  Uterine Assessment: Contraction strength is mild.  Contraction frequency is irregular.   Abdomen: Patient reports no abdominal tenderness. Estimated fetal weight is 6.   Fetal presentation: vertex  Introitus: Normal vulva. Vagina is positive for vaginal discharge.  Ferning test: positive.  Nitrazine test: not done. Amniotic fluid character: clear.  Pelvis: adequate for delivery.   Cervix: Cervix evaluated by digital exam.     Fetal Exam Fetal Monitor Review: Mode: ultrasound.   Baseline rate: 140.  Variability: moderate (6-25 bpm).   Pattern: accelerations present and no decelerations.    Fetal State Assessment: Category I - tracings are normal.     Physical Exam  Constitutional: She is oriented to person, place, and time. She appears well-developed and well-nourished. No distress.  HENT:  Head: Normocephalic.  Cardiovascular: Normal rate and regular rhythm.  Respiratory: Effort normal. No respiratory distress.  GI: Soft. She exhibits no distension. There is no tenderness. There is no rebound and no guarding.  Genitourinary: Vaginal discharge found.  Genitourinary Comments: Bedside US which confirmed vertex presentation  Musculoskeletal: Normal range of motion.  Neurological: She is alert and oriented to person, place, and time.  Skin: Skin is warm and dry.  Psychiatric: She has a normal mood and affect.    Prenatal labs: ABO, Rh: A/Positive/-- (01/07 0935) Antibody: Negative (01/07 0935)  Rubella: 1.93 (01/07 0935) RPR: Non Reactive (03/08 0917)  HBsAg: Negative (01/07 0935)  HIV: Non Reactive (03/08 0917)  GBS:     Assessment/Plan: Single IUP at [redacted]w[redacted]d PPROM Early Labor  Admit to [facility] Routine orders Betamethasone    Wynelle Bourgeois 01/31/2018, 12:12 AM

## 2018-01-31 NOTE — Progress Notes (Signed)
Patient ID: Jennifer Prince, female   DOB: Mar 22, 1996, 22 y.o.   MRN: [id number] Now has epidural  Vitals:   01/31/18 0531 01/31/18 0601 01/31/18 0631 01/31/18 0702  BP: (!) 111/56 116/67 112/68 120/61  Pulse: 80 81 89 74  Resp: Temp:      TempSrc:      SpO2:      Weight:      Height:       FHR reactive UCs every 2 min  Dilation: 4.5 Effacement (%): 70 Cervical Position: Middle Station: -2 Presentation: Vertex Exam by:: Hiawatha Dressel   Continue to observe

## 2018-01-31 NOTE — Anesthesia Pain Management Evaluation Note (Signed)
  CRNA Pain Management Visit Note  Patient: Jennifer Prince, 22 y.o., female  "Hello I am a member of the anesthesia team at [facility]. We have an anesthesia team available at all times to provide care throughout the hospital, including epidural management and anesthesia for C-section. I don't know your plan for the delivery whether it a natural birth, water birth, IV sedation, nitrous supplementation, doula or epidural, but we want to meet your pain goals."   1.Was your pain managed to your expectations on prior hospitalizations?   Yes   2.What is your expectation for pain management during this hospitalization?     Epidural  3.How can we help you reach that goal? Support prn  Record the patient's initial score and the patient's pain goal.   Pain: 0  Pain Goal: 2 [facility] wants you to be able to say your pain was always managed very well.  [facility] 01/31/2018

## 2018-02-01 LAB — CULTURE, BETA STREP (GROUP B ONLY): STREP GP B CULTURE: POSITIVE — AB

## 2018-02-01 NOTE — Progress Notes (Signed)
POSTPARTUM PROGRESS NOTE  Post Partum Day 1  Subjective:  Jennifer Prince is a 22 y.o. [id number] s/p SVD at 105w5d.  She reports she is doing well. No acute events overnight, and deniesa She denies any problems with ambulating, voiding or po intake. Lochia moderate.  Objective: Blood pressure 118/68, pulse 74, temperature 98 F (36.7 C), temperature source Oral, resp. rate 18, height  (1.905 m), weight 168 lb (76.2 kg), last menstrual period 05/16/2017, SpO2 100 %, unknown if currently breastfeeding.  Physical Exam:  General: alert, cooperative and no distress Chest: no respiratory distress Heart:regular rate, distal pulses intact Abdomen: soft, nontender,  Uterine Fundus: firm, appropriately tender DVT Evaluation: No calf swelling or tenderness Extremities: no edema Skin: warm, dry  Recent Labs    01/30/18 2340  HGB 10.7*  HCT 31.4*    Assessment/Plan: Carloyn Ghan is a 22 y.o. G2P1001 s/p SVD at [redacted]w[redacted]d   PPD#1 - Doing well  Routine postpartum care Contraception: discussed options this morning with her Feeding: bottle Dispo: Plan for discharge tomorrow.   LOS: 2 days   Kandra Nicolas DegeleMD 02/01/2018, 7:13 AM

## 2018-02-01 NOTE — Lactation Note (Signed)
This note was copied from a baby's chart. Lactation Consultation Note Baby 14 hrs. Mom chooses to pump and bottle feed. Mom is giving 22 cal Similac formula and colostrum that she pumps and hand expresses. Mom is pumping every 3 hrs. Mom encouraged to feed baby 8-12 times/24 hours and with feeding cues. Mom encouraged to waken baby for feeds.  LPI  information sheet given and reviewed. Mom holding baby STS, I&O, supply and demand discussed. WH/LC brochure given w/resources, support groups and LC services.  Patient Name: Girl Gena Laski [id number] Date: 02/01/2018 Reason for consult: Initial assessment;Late-preterm 34-36.6wks;Infant < 6lbs   Maternal Data Has patient been taught Hand Expression?: Yes Does the patient have breastfeeding experience prior to this delivery?: Yes  Feeding Feeding Type: Breast Milk with Formula added Nipple Type: Slow - flow  LATCH Score                   Interventions Interventions: DEBP;Skin to skin  Lactation Tools Discussed/Used Tools: Pump Breast pump type: Double-Electric Breast Pump WIC Program: Yes Pump Review: Setup, frequency, and cleaning;Milk Storage Initiated by:: RN Date initiated:: 01/31/18   Consult Status Consult Status: Follow-up Date: 02/02/18 Follow-up type: In-patient    Ridgely Anastacio, Diamond Nickel 02/01/2018, 4:02 AM

## 2018-02-02 LAB — BIRTH TISSUE RECOVERY COLLECTION (PLACENTA DONATION)

## 2018-02-02 MED ORDER — IBUPROFEN 600 MG PO TABS: 600.0000 mg | ORAL_TABLET | Freq: Four times a day (QID) | ORAL | 0 refills | Status: AC

## 2018-02-02 MED ORDER — PNEUMOCOCCAL VAC POLYVALENT 25 MCG/0.5ML IJ INJ
0.5000 mL | INJECTION | Freq: Once | INTRAMUSCULAR | Status: DC
  Filled 2018-02-02: qty 0.5

## 2018-02-02 NOTE — Lactation Note (Signed)
This note was copied from a baby's chart. Lactation Consultation Note  Patient Name: Jennifer Prince [id number] Date: 02/02/2018  [facility] Follow Up Per Previous LC Request:  Following up with mother to be sure she is feeding appropriate amounts of formula for her LPTI who is now < 6 lbs.  Mother stated she last fed at 2215 and baby took 20 mls.  She is currently holding infant STS.  I praised her efforts and reminded her to continue feeding 20-30 mls q 3 hours (or earlier if infant ready) and to awaken at the 3 hour interval if sleeping.  Mother verbalized understanding.                 Aeneas Longsworth R Ikaika Showers 02/02/2018, 12:15 AM

## 2018-02-02 NOTE — Discharge Instructions (Signed)
Vaginal Delivery, Care After °Refer to this sheet in the next few weeks. These instructions provide you with information about caring for yourself after vaginal delivery. Your health care provider may also give you more specific instructions. Your treatment has been planned according to current medical practices, but problems sometimes occur. Call your health care provider if you have any problems or questions. °What can I expect after the procedure? °After vaginal delivery, it is common to have: °· Some bleeding from your vagina. °· Soreness in your abdomen, your vagina, and the area of skin between your vaginal opening and your anus (perineum). °· Pelvic cramps. °· Fatigue. ° °Follow these instructions at home: °Medicines °· Take over-the-counter and prescription medicines only as told by your health care provider. °· If you were prescribed an antibiotic medicine, take it as told by your health care provider. Do not stop taking the antibiotic until it is finished. °Driving ° °· Do not drive or operate heavy machinery while taking prescription pain medicine. °· Do not drive for 24 hours if you received a sedative. °Lifestyle °· Do not drink alcohol. This is especially important if you are breastfeeding or taking medicine to relieve pain. °· Do not use tobacco products, including cigarettes, chewing tobacco, or e-cigarettes. If you need help quitting, ask your health care provider. °Eating and drinking °· Drink at least 8 eight-ounce glasses of water every day unless you are told not to by your health care provider. If you choose to breastfeed your baby, you may need to drink more water than this. °· Eat high-fiber foods every day. These foods may help prevent or relieve constipation. High-fiber foods include: °? Whole grain cereals and breads. °? Brown rice. °? Beans. °? Fresh fruits and vegetables. °Activity °· Return to your normal activities as told by your health care provider. Ask your health care provider  what activities are safe for you. °· Rest as much as possible. Try to rest or take a nap when your baby is sleeping. °· Do not lift anything that is heavier than your baby or 10 lb (4.5 kg) until your health care provider says that it is safe. °· Talk with your health care provider about when you can engage in sexual activity. This may depend on your: °? Risk of infection. °? Rate of healing. °? Comfort and desire to engage in sexual activity. °Vaginal Care °· If you have an episiotomy or a vaginal tear, check the area every day for signs of infection. Check for: °? More redness, swelling, or pain. °? More fluid or blood. °? Warmth. °? Pus or a bad smell. °· Do not use tampons or douches until your health care provider says this is safe. °· Watch for any blood clots that may pass from your vagina. These may look like clumps of dark red, brown, or black discharge. °General instructions °· Keep your perineum clean and dry as told by your health care provider. °· Wear loose, comfortable clothing. °· Wipe from front to back when you use the toilet. °· Ask your health care provider if you can shower or take a bath. If you had an episiotomy or a perineal tear during labor and delivery, your health care provider may tell you not to take baths for a certain length of time. °· Wear a bra that supports your breasts and fits you well. °· If possible, have someone help you with household activities and help care for your baby for at least a few days after   you leave the hospital. °· Keep all follow-up visits for you and your baby as told by your health care provider. This is important. °Contact a health care provider if: °· You have: °? Vaginal discharge that has a bad smell. °? Difficulty urinating. °? Pain when urinating. °? A sudden increase or decrease in the frequency of your bowel movements. °? More redness, swelling, or pain around your episiotomy or vaginal tear. °? More fluid or blood coming from your episiotomy or  vaginal tear. °? Pus or a bad smell coming from your episiotomy or vaginal tear. °? A fever. °? A rash. °? Little or no interest in activities you used to enjoy. °? Questions about caring for yourself or your baby. °· Your episiotomy or vaginal tear feels warm to the touch. °· Your episiotomy or vaginal tear is separating or does not appear to be healing. °· Your breasts are painful, hard, or turn red. °· You feel unusually sad or worried. °· You feel nauseous or you vomit. °· You pass large blood clots from your vagina. If you pass a blood clot from your vagina, save it to show to your health care provider. Do not flush blood clots down the toilet without having your health care provider look at them. °· You urinate more than usual. °· You are dizzy or light-headed. °· You have not breastfed at all and you have not had a menstrual period for 12 weeks after delivery. °· You have stopped breastfeeding and you have not had a menstrual period for 12 weeks after you stopped breastfeeding. °Get help right away if: °· You have: °? Pain that does not go away or does not get better with medicine. °? Chest pain. °? Difficulty breathing. °? Blurred vision or spots in your vision. °? Thoughts about hurting yourself or your baby. °· You develop pain in your abdomen or in one of your legs. °· You develop a severe headache. °· You faint. °· You bleed from your vagina so much that you fill two sanitary pads in one hour. °This information is not intended to replace advice given to you by your health care provider. Make sure you discuss any questions you have with your health care provider. °Document Released: 09/12/2000 Document Revised: 02/27/2016 Document Reviewed: 09/30/2015 °Elsevier Interactive Patient Education © 2018 [employer] Inc[employer] ° °

## 2018-02-02 NOTE — Discharge Summary (Addendum)
OB Discharge Summary     Patient Name: Jennifer Prince DOB: [birth year] MRN: [id number]  Date of admission: 01/30/2018 Delivering MD: Myrene Buddy   Date of discharge: 02/02/2018  Admitting diagnosis: 37 WKS, WATER BROKE Intrauterine pregnancy: [redacted]w[redacted]d     Secondary diagnosis:  Active Problems:   NSVD (normal spontaneous vaginal delivery)  Additional problems: PPROM     Discharge diagnosis: Preterm Pregnancy Delivered                                                                                             Post partum procedures:none  Augmentation: AROM forebag  Complications: None  Hospital course:  Onset of Labor With Vaginal Delivery     22 y.o. yo G2P1001 at [redacted]w[redacted]d was admitted in Latent Labor on 01/30/2018. Patient had an uncomplicated labor course as follows:  Membrane Rupture Time/Date: 8:00 PM ,01/30/2018   Intrapartum Procedures: Episiotomy: None [1]                                         Lacerations:  None [1]  Patient had a delivery of a Viable infant. 01/31/2018  Information for the patient's newborn:  Jennifer Prince [[id number]]  Delivery Method: Vaginal, Spontaneous(Filed from Delivery Summary)    Pateint had an uncomplicated postpartum course.  She is ambulating, tolerating a regular diet, passing flatus, and urinating well. Patient is discharged home in stable condition on 02/02/18.   Physical exam  Vitals:   01/31/18 2114 02/01/18 0510 02/01/18 1800 02/02/18 0541  BP: (!) 108/57 118/68 112/73 118/71  Pulse: 80 74 84 62  Resp: Temp: 98.3 F (36.8 C) 98 F (36.7 C) 99 F (37.2 C) 98 F (36.7 C)  TempSrc: Oral Oral Oral Oral  SpO2: 100% 100% 99%   Weight:      Height:       General: alert, cooperative and no distress Lochia: appropriate Uterine Fundus: firm Incision: N/A DVT Evaluation: No evidence of DVT seen on physical exam. Negative Homan's sign. Labs: Lab Results  Component Value Date   WBC 13.1 (H) 01/30/2018   HGB 10.7 (L)  01/30/2018   HCT 31.4 (L) 01/30/2018   MCV 92.6 01/30/2018   PLT 210 01/30/2018   No flowsheet data found.  Discharge instruction: per After Visit Summary and "Baby and Me Booklet".  After visit meds:  Allergies as of 02/02/2018   No Known Allergies     Medication List    STOP taking these medications   PRENATAL VITAMINS PO     TAKE these medications   ibuprofen 600 MG tablet Commonly known as:  ADVIL,MOTRIN Take 1 tablet (600 mg total) by mouth every 6 (six) hours.       Diet: routine diet  Activity: Advance as tolerated. Pelvic rest for 6 weeks.   Outpatient follow up:6 weeks Follow up Appt:No future appointments. Follow up Visit: Follow-up Information    [facility] Follow up in 6 week(s).   Why:  Please call to schedule  a follow up appointment in 4-6 weeks with your OB/Gyn          Postpartum contraception: None  Newborn Data: Live born female  Birth Weight: 5 lb 12.2 oz (2614 g) APGAR: 9, 9  Newborn Delivery   Birth date/time:  01/31/2018 13:17:00 Delivery type:  Vaginal, Spontaneous     Baby Feeding: Bottle Disposition:home with mother   02/02/2018 Myrene Buddy, MD  OB FELLOW DISCHARGE ATTESTATION  I have seen and examined this patient. I agree with above documentation and have made edits as needed.   Caryl Ada, DO OB Fellow 2:42 PM

## 2018-02-03 ENCOUNTER — Ambulatory Visit: Payer: Self-pay

## 2018-02-03 NOTE — Lactation Note (Signed)
This note was copied from a baby's chart. Lactation Consultation Note  Patient Name: Jennifer Prince [id number] Date: 02/03/2018  Mom states that she has decided to bottle feed only.  She has not been pumping.  Breast care reviewed with her.  No questions.   Maternal Data    Feeding Feeding Type: Formula Nipple Type: Slow - flow  LATCH Score                   Interventions    Lactation Tools Discussed/Used     Consult Status      Huston Foley 02/03/2018, 9:10 AM

## 2018-02-04 ENCOUNTER — Encounter: Payer: Self-pay | Admitting: Medical

## 2018-02-11 ENCOUNTER — Encounter: Payer: Self-pay | Admitting: Student

## 2018-02-18 ENCOUNTER — Encounter: Payer: Self-pay | Admitting: Student

## 2018-05-19 ENCOUNTER — Encounter (HOSPITAL_COMMUNITY): Payer: Self-pay

## 2018-07-04 IMAGING — CR DG CHEST 2V
2 series · 2 of 2 positions shown · non-contrast
Comparison: None.

CLINICAL DATA: Chills cough nausea and throat pain for several days

EXAM:
CHEST  2 VIEW

[w chest pa]
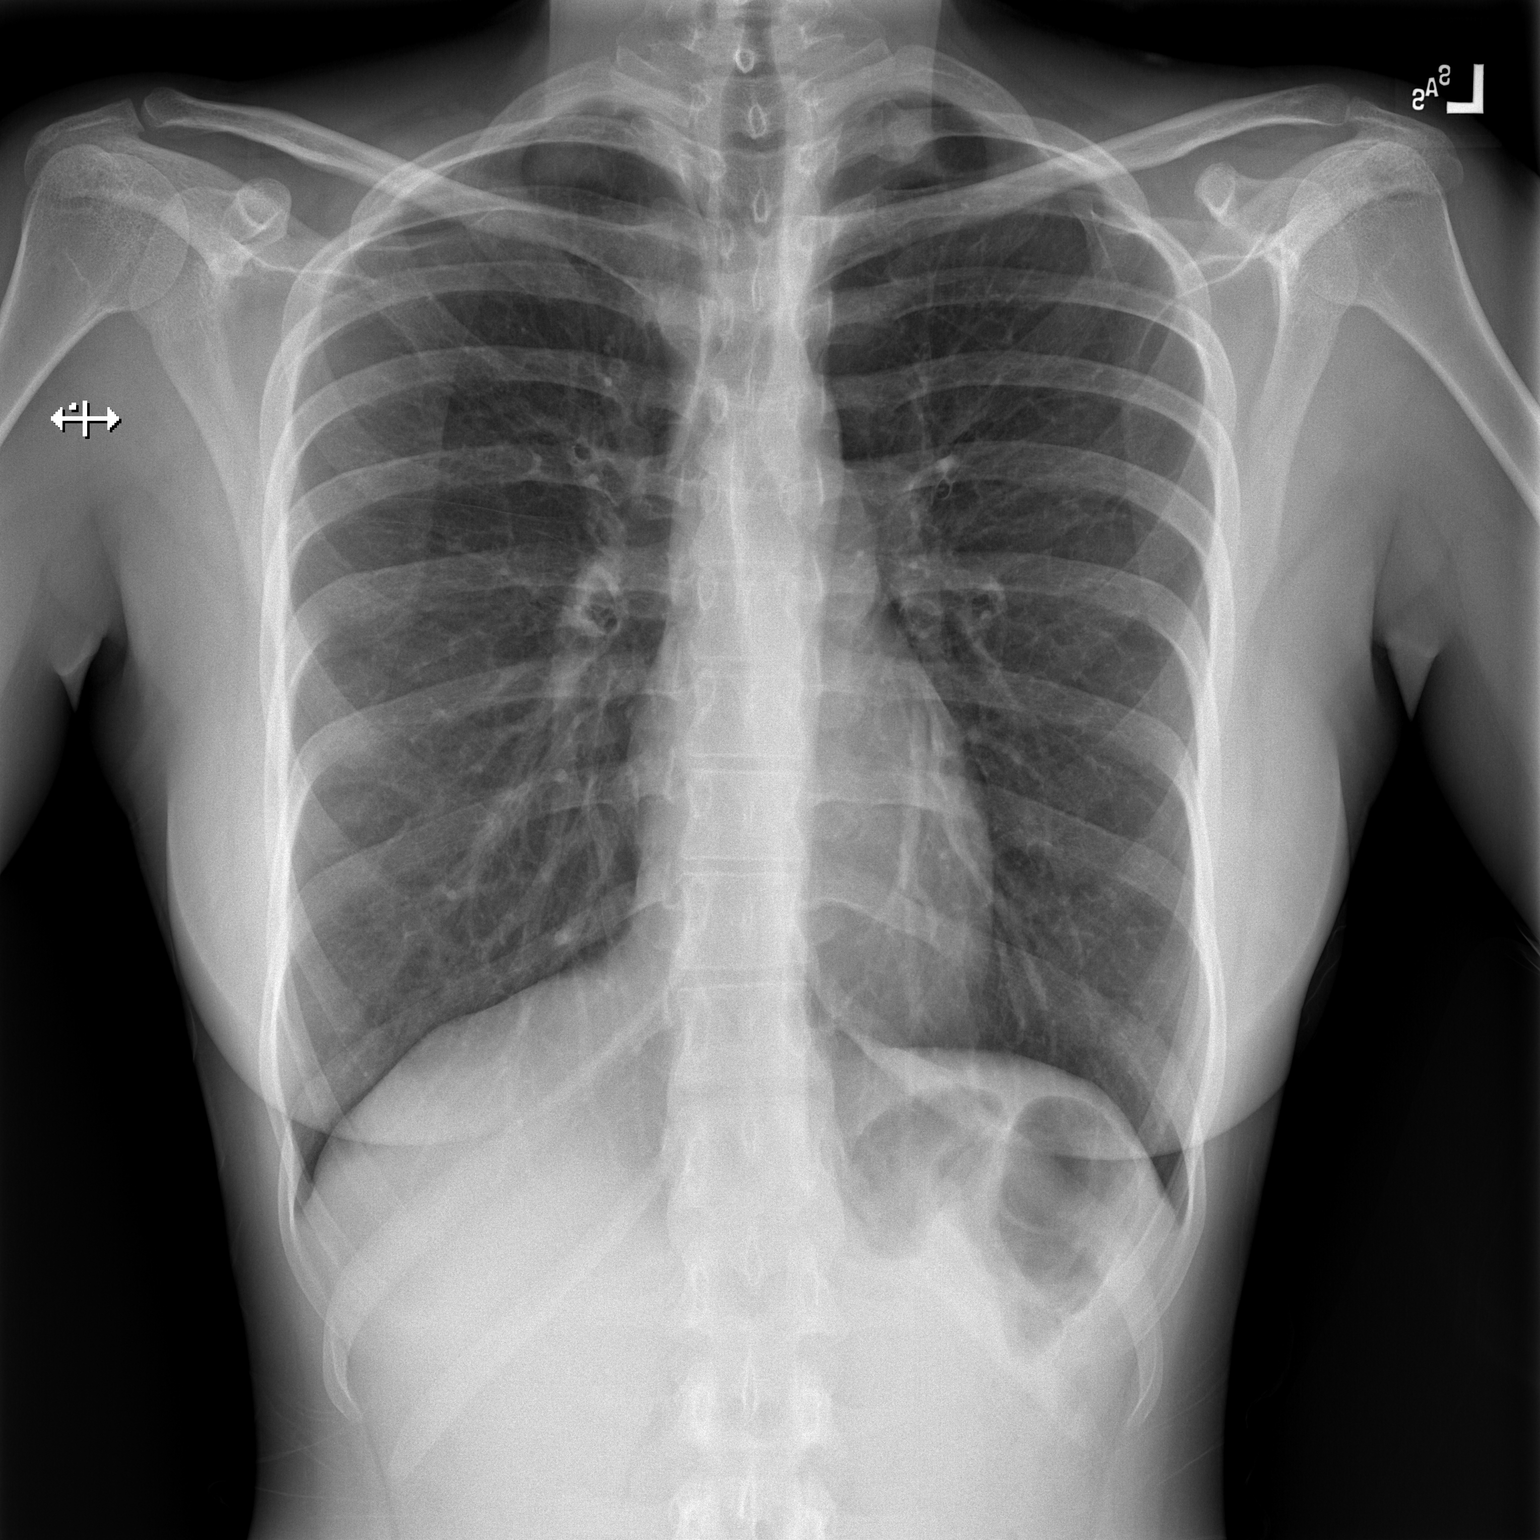

[w chest lat]
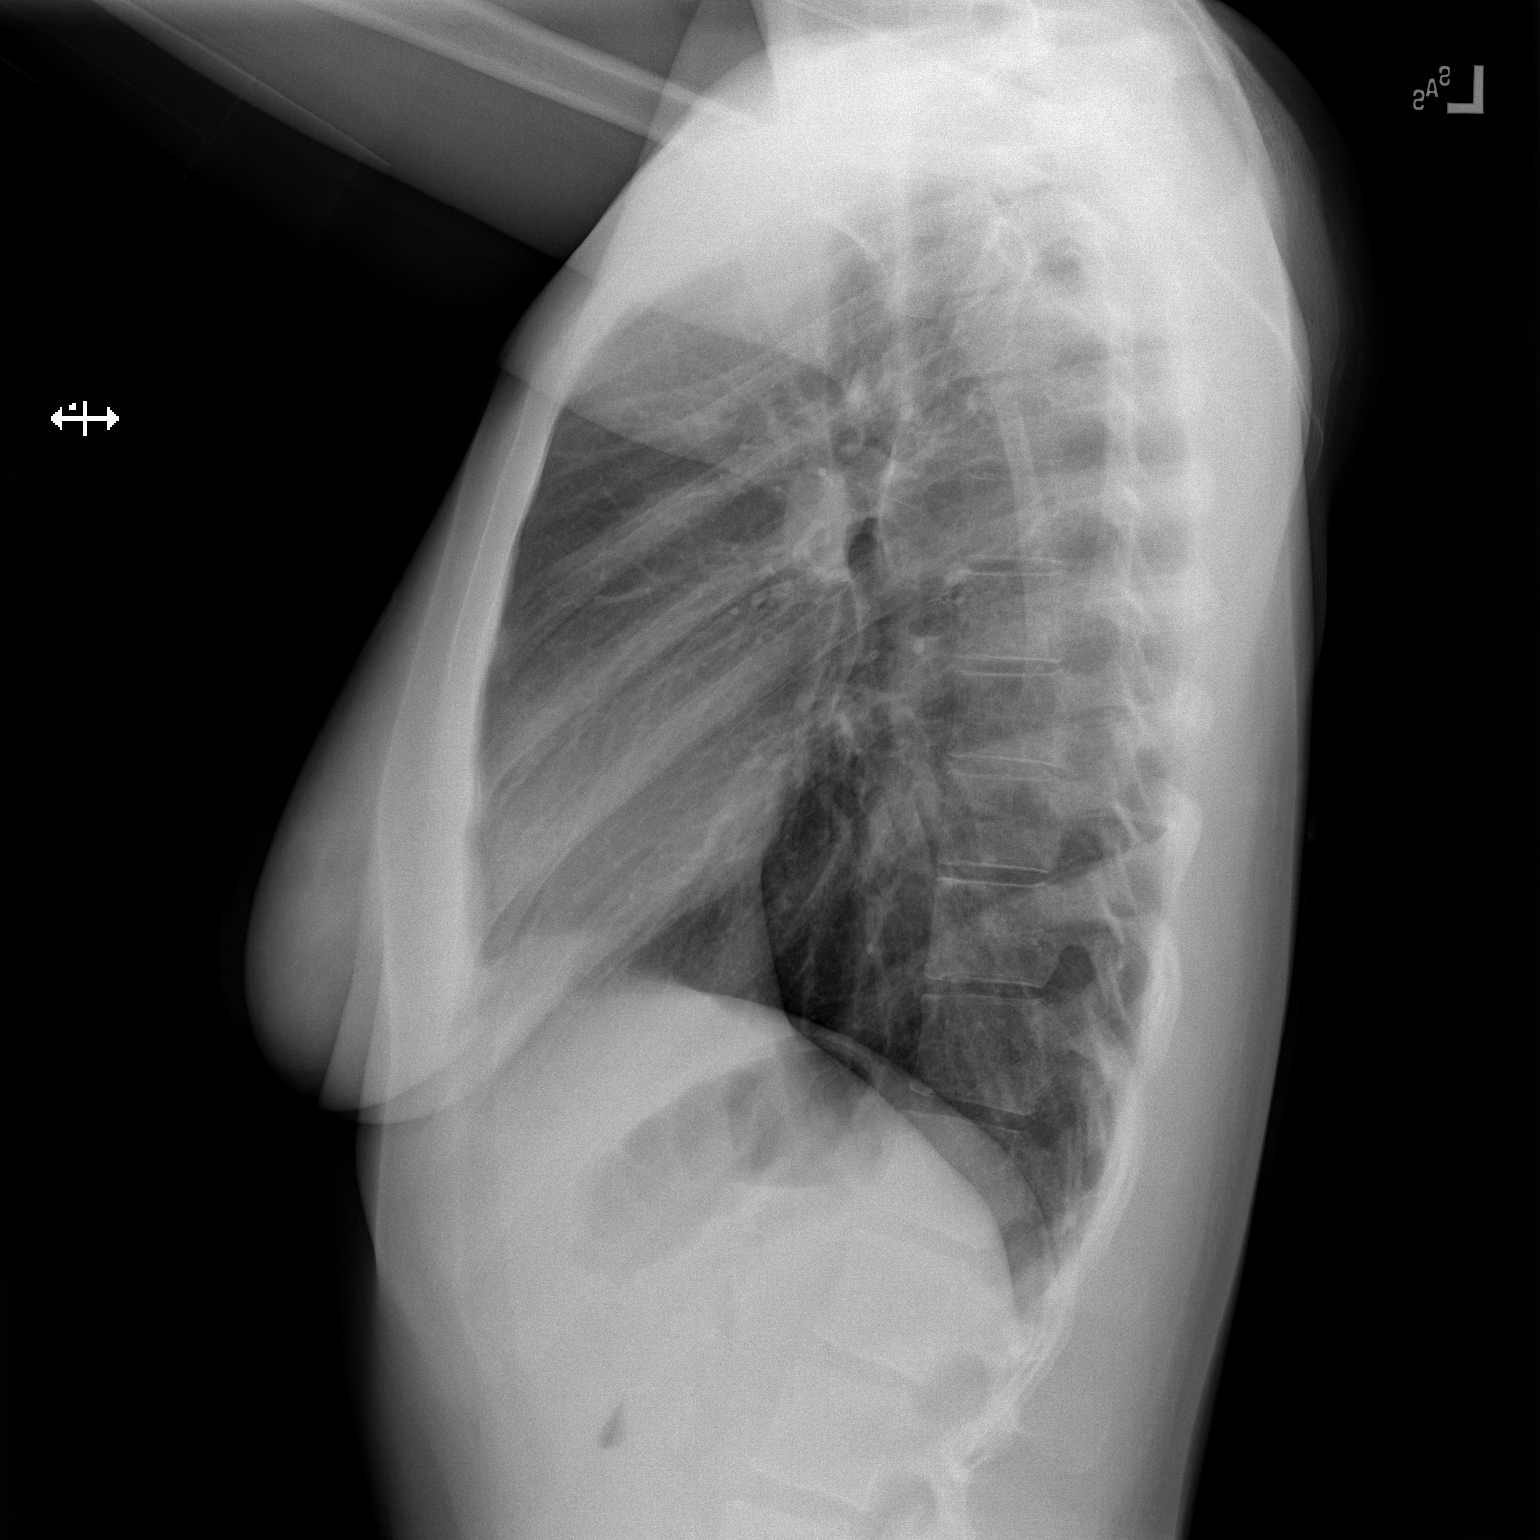

[2 of 2 positions shown; findings below may reference images not displayed]

FINDINGS: The lungs are clear. The pulmonary vasculature is normal. Heart size
is normal. Hilar and mediastinal contours are unremarkable. There is
no pleural effusion.
IMPRESSION: No active cardiopulmonary disease.

## 2018-09-20 ENCOUNTER — Emergency Department (HOSPITAL_COMMUNITY)
Admission: EM | Admit: 2018-09-20 | Discharge: 2018-09-20 | Disposition: A | Discharge status: Home / Self Care | Payer: Medicaid Other | Attending: Emergency Medicine | Admitting: Emergency Medicine

## 2018-09-20 ENCOUNTER — Other Ambulatory Visit: Payer: Self-pay

## 2018-09-20 ENCOUNTER — Encounter (HOSPITAL_COMMUNITY): Payer: Self-pay | Admitting: *Deleted

## 2018-09-20 DIAGNOSIS — F17290 Nicotine dependence, other tobacco product, uncomplicated: Secondary | ICD-10-CM | POA: Insufficient documentation

## 2018-09-20 DIAGNOSIS — R221 Localized swelling, mass and lump, neck: Secondary | ICD-10-CM | POA: Diagnosis present

## 2018-09-20 DIAGNOSIS — Z3A00 Weeks of gestation of pregnancy not specified: Secondary | ICD-10-CM | POA: Insufficient documentation

## 2018-09-20 DIAGNOSIS — O9989 Other specified diseases and conditions complicating pregnancy, childbirth and the puerperium: Secondary | ICD-10-CM | POA: Diagnosis not present

## 2018-09-20 DIAGNOSIS — O99330 Smoking (tobacco) complicating pregnancy, unspecified trimester: Secondary | ICD-10-CM | POA: Diagnosis not present

## 2018-09-20 DIAGNOSIS — R590 Localized enlarged lymph nodes: Secondary | ICD-10-CM | POA: Insufficient documentation

## 2018-09-20 NOTE — ED Provider Notes (Signed)
[facility] EMERGENCY DEPARTMENT Provider Note   CSN: [id number] Arrival date & time: 09/20/18  0247     History   Chief Complaint Chief Complaint  Patient presents with  . neck pain    HPI Jennifer Prince is a 22 y.o. female presenting today for small bump to the left lateral neck that is been present for approximately 1 week.  Patient endorses a mild constant aching sensation to the area that is worsened with palpation.  Patient denies injury or trauma to the area.  She denies difficulty breathing or pain with swallowing.  Patient states that she has had a runny nose for the past few days but is otherwise been feeling well.  Patient states that she had not noticed the bump prior to 1 week ago.  Of note patient states that she had a positive pregnancy test last week at home and plans to follow-up with her OB/GYN this week for follow-up.  She denies abdominal pain, nausea/vomiting, vaginal bleeding or additional concerns at this time. HPI  Past Medical History:  Diagnosis Date  . Medical history non-contributory     Patient Active Problem List   Diagnosis Date Noted  . NSVD (normal spontaneous vaginal delivery) 02/02/2018  . Preterm premature rupture of membranes (PPROM) with unknown onset of labor 01/31/2018  . Indication for care in labor or delivery 01/30/2018  . Supervision of other normal pregnancy, antepartum 10/05/2017    Past Surgical History:  Procedure Laterality Date  . NO PAST SURGERIES       OB History    Gravida  2   Para  1   Term  1   Preterm  0   AB  0   Living  1     SAB  0   TAB  0   Ectopic  0   Multiple  0   Live Births  1            Home Medications    Prior to Admission medications   Medication Sig Start Date End Date Taking? Authorizing Provider  ibuprofen (ADVIL,MOTRIN) 600 MG tablet Take 1 tablet (600 mg total) by mouth every 6 (six) hours. 02/02/18   Myrene Buddy, MD    Family History No family  history on file.  Social History Social History   Tobacco Use  . Smoking status: Current Some Day Smoker  . Smokeless tobacco: Current User  Substance Use Topics  . Alcohol use: No  . Drug use: No     Allergies   Patient has no known allergies.   Review of Systems Review of Systems  Constitutional: Negative.  Negative for chills and fever.  HENT: Positive for congestion and rhinorrhea. Negative for ear discharge, ear pain, sore throat, trouble swallowing and voice change.   Gastrointestinal: Negative.  Negative for abdominal pain, nausea and vomiting.  Genitourinary: Negative.  Negative for pelvic pain, vaginal bleeding and vaginal discharge.  Skin: Negative for color change and wound.       Bump to left lateral neck   Physical Exam Updated Vital Signs BP 120/70 (BP Location: Right Arm)   Pulse 86   Temp 98.2 F (36.8 C) (Oral)   Resp 14   LMP 08/21/2018   SpO2 100%   Physical Exam Constitutional:      General: She is not in acute distress.    Appearance: Normal appearance. She is well-developed. She is not ill-appearing or diaphoretic.  HENT:     Head: Normocephalic  and atraumatic.     Right Ear: Hearing, tympanic membrane, ear canal and external ear normal.     Left Ear: Hearing, tympanic membrane, ear canal and external ear normal.     Nose: Rhinorrhea present. Rhinorrhea is clear.     Mouth/Throat:     Lips: Pink.     Mouth: Mucous membranes are moist.     Pharynx: Oropharynx is clear.     Comments: The patient has normal phonation and is in control of secretions. No stridor.  Midline uvula without edema. Soft palate rises symmetrically. No tonsillar erythema, swelling or exudates. Tongue protrusion is normal, floor of mouth is soft. No trismus. No creptius on neck palpation. No gingival erythema or fluctuance noted. Mucus membranes moist. Eyes:     Extraocular Movements: Extraocular movements intact.     Conjunctiva/sclera: Conjunctivae normal.     Pupils:  Pupils are equal, round, and reactive to light.  Neck:     Musculoskeletal: Full passive range of motion without pain, normal range of motion and neck supple. No edema, erythema, neck rigidity, injury, spinous process tenderness or muscular tenderness.     Trachea: Trachea and phonation normal. No tracheal tenderness or tracheal deviation.     Comments: Patient with single approx. 5mm in diameter bump to left lateral neck. No fluctuance or erythema. No surrounding induration or streaking. Consistent with single lymph node. Cardiovascular:     Rate and Rhythm: Normal rate and regular rhythm.     Pulses: Normal pulses.          Dorsalis pedis pulses are 2+ on the right side and 2+ on the left side.       Posterior tibial pulses are 2+ on the right side and 2+ on the left side.     Heart sounds: Normal heart sounds.  Pulmonary:     Effort: Pulmonary effort is normal. No respiratory distress.     Breath sounds: Normal breath sounds and air entry.  Abdominal:     General: Bowel sounds are normal.     Palpations: Abdomen is soft.     Tenderness: There is no abdominal tenderness. There is no guarding or rebound.  Musculoskeletal: Normal range of motion.     Right lower leg: No edema.     Left lower leg: No edema.  Feet:     Right foot:     Protective Sensation: 3 sites tested. 3 sites sensed.     Left foot:     Protective Sensation: 3 sites tested. 3 sites sensed.  Lymphadenopathy:     Cervical: Cervical adenopathy present.     Left cervical: Superficial cervical adenopathy present.  Skin:    General: Skin is warm and dry.     Capillary Refill: Capillary refill takes less than 2 seconds.  Neurological:     General: No focal deficit present.     Mental Status: She is alert.     GCS: GCS eye subscore is 4. GCS verbal subscore is 5. GCS motor subscore is 6.     Comments: Speech is clear and goal oriented, follows commands Major Cranial nerves without deficit, no facial droop Sensation  normal to light touch Moves extremities without ataxia, coordination intact Normal gait  Psychiatric:        Mood and Affect: Mood normal.        Behavior: Behavior normal.    ED Treatments / Results  Labs (all labs ordered are listed, but only abnormal results are displayed) Labs Reviewed -  No data to display  EKG None  Radiology No results found.  Procedures Procedures (including critical care time)  Medications Ordered in ED Medications - No data to display   Initial Impression / Assessment and Plan / ED Course  I have reviewed the triage vital signs and the nursing notes.  Pertinent labs & imaging results that were available during my care of the patient were reviewed by me and considered in my medical decision making (see chart for details).    22 year old otherwise healthy female presenting today for single left cervical anterior chain lymph node that is been present for 1 week.  Patient with signs of mild URI on examination today.  No signs of bacterial infection of the head/neck.  No signs of lymph node abscess or superinfection at this time. Patient has been informed to follow-up with her primary care provider for this lymph node and monitor for changes.  Additionally patient reports that she is pregnant today, she has no concerns with her pregnancy at this time and states that she is taking prenatal vitamins and has an OB/GYN that she can follow-up with.  I have strongly encouraged that patient follow-up with her OB/GYN this week regarding her pregnancy.  Patient also informed to take prenatal vitamins as directed on the packaging.  At discharge patient is afebrile, not tachycardic, not hypotensive, well-appearing and in no acute distress.  At this time there does not appear to be any evidence of an acute emergency medical condition and the patient appears stable for discharge with appropriate outpatient follow up. Diagnosis was discussed with patient who verbalizes  understanding of care plan and is agreeable to discharge. I have discussed return precautions with patient who verbalizes understanding of return precautions. Patient strongly encouraged to follow-up with her OB/GYN and PCP this week. All questions answered.  Patient's case discussed with Dr. Preston FleetingGlick who agrees with plan to discharge with follow-up.   Note: Portions of this report may have been transcribed using voice recognition software. Every effort was made to ensure accuracy; however, inadvertent computerized transcription errors may still be present. Final Clinical Impressions(s) / ED Diagnoses   Final diagnoses:  Cervical lymphadenopathy    ED Discharge Orders    None       Elizabeth PalauMorelli, Rayelle Armor A, PA-C 09/20/18 0346    Dione BoozeGlick, David, MD 09/20/18 [phone]

## 2018-09-20 NOTE — Discharge Instructions (Signed)
You have been diagnosed today with cervical lymphadenopathy.  At this time there does not appear to be the presence of an emergent medical condition, however there is always the potential for conditions to change. Please read and follow the below instructions.  Please return to the Emergency Department immediately for any new or worsening symptoms or if your symptoms do not improve within 1 week. Please be sure to follow up with your Primary Care Provider this week regarding your visit today; please call their office to schedule an appointment even if you are feeling better for a follow-up visit. You may follow-up with the [facility] or [place] [facility] to establish a primary care provider and have your lymph node rechecked. Please follow-up with your OB/GYN this week regarding your pregnancy.  If you are unable to follow-up with your OB/GYN you may go to the women's clinic on your discharge paperwork for further treatment regarding her pregnancy. Please read the information packet given and be sure to take prenatal vitamins to help with your pregnancy.  Contact a health care provider if you have: Swelling that gets worse or spreads to other areas. Problems with breathing. Lymph glands that: Are still swollen after 2 weeks. Have suddenly gotten bigger. Are red, painful, or hard. A fever or chills. Fatigue. A sore throat. Pain in your abdomen. Weight loss. Night sweats. Get help right away if you have: Fluid leaking from an enlarged lymph gland. Severe pain. Chest pain. Shortness of breath. Get help right away if: You have a fever. You are leaking fluid from your vagina. You have spotting or bleeding from your vagina. You have severe abdominal cramping or pain. You have rapid weight gain or loss. You vomit blood or material that looks like coffee grounds. You develop a severe headache. You have shortness of breath. You have any kind of  trauma, such as from a fall or a car accident.  Please read the additional information packets attached to your discharge summary.  Do not take your medicine if  develop an itchy rash, swelling in your mouth or lips, or difficulty breathing.

## 2018-09-20 NOTE — ED Triage Notes (Signed)
The pt is c/o a lump in her neck for one week  No pain at present  Pos preg test last month missed period

## 2018-10-01 IMAGING — US US OB COMP LESS 14 WK
1 series · 15 of 28 positions shown · non-contrast
Comparison: None.

CLINICAL DATA: Vaginal bleeding. Estimated gestational age per LMP
is 5 weeks 5 days with quantitative beta HCG [DATE].

EXAM:
OBSTETRIC <14 WK US AND TRANSVAGINAL OB US
TECHNIQUE: Both transabdominal and transvaginal ultrasound examinations were
performed for complete evaluation of the gestation as well as the
maternal uterus, adnexal regions, and pelvic cul-de-sac.
Transvaginal technique was performed to assess early pregnancy.

[Series 1: us ob comp less 14 wk · 15 of 54 slices shown]
[im 1/54]
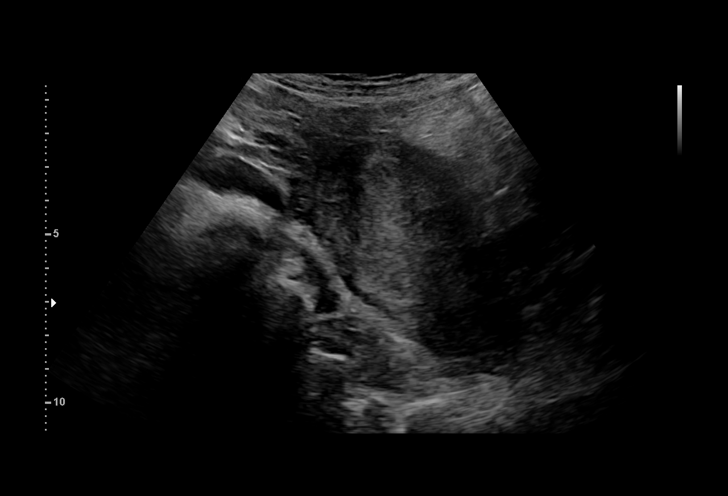
[im 4/54]
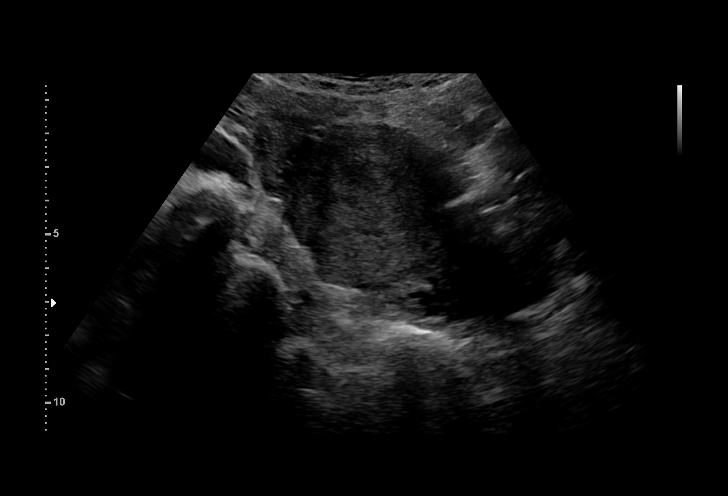
[im 8/54]
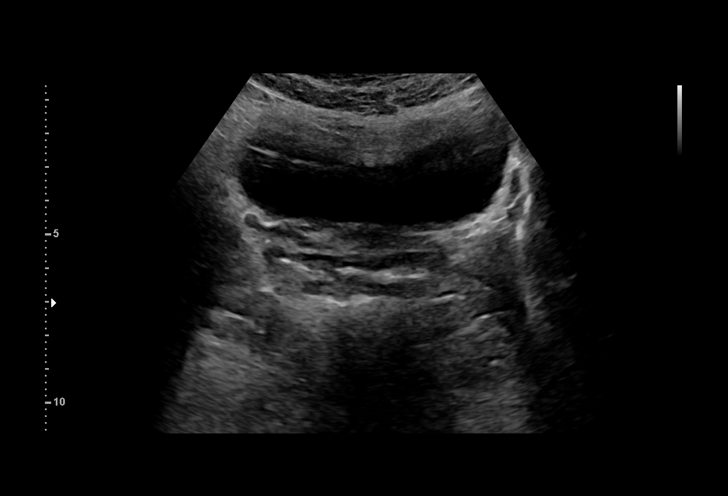
[im 12/54]
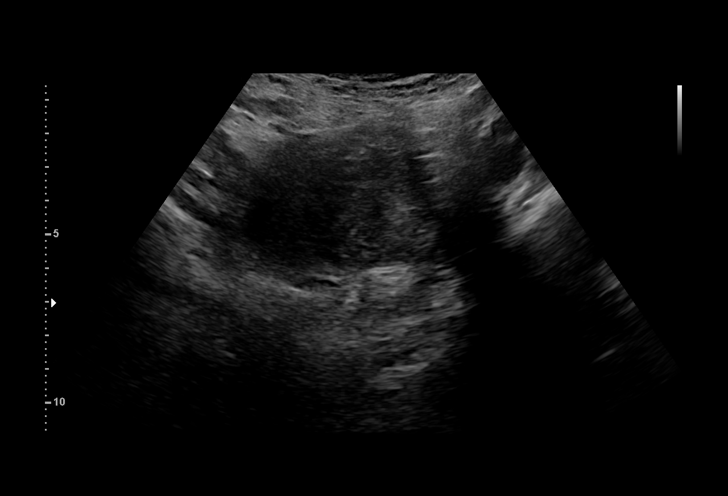
[im 16/54]
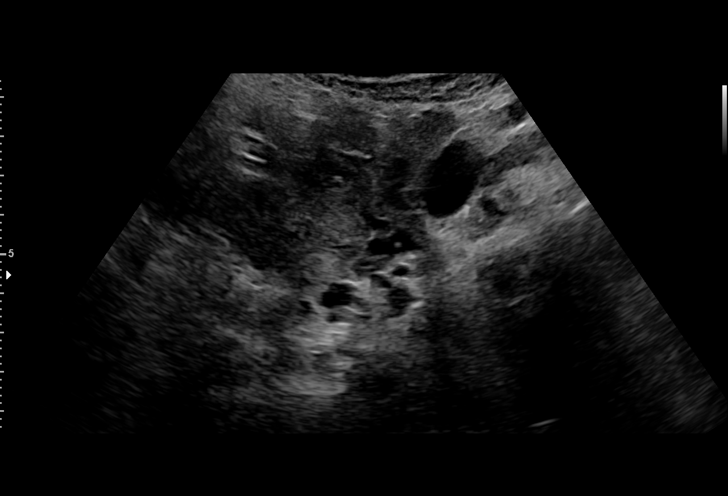
[im 20/54]
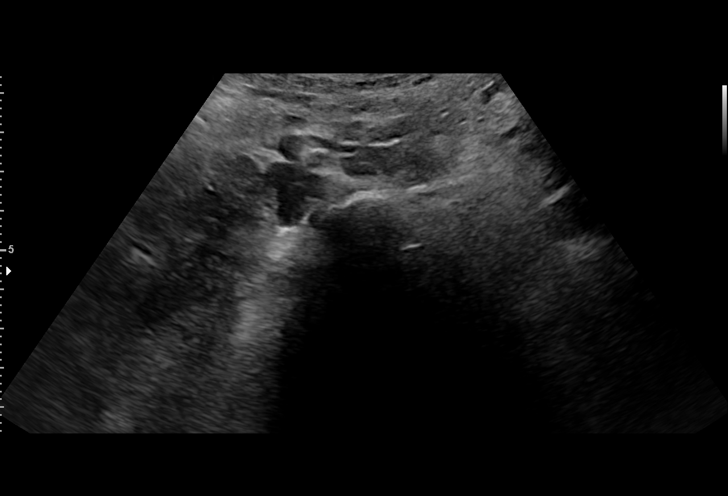
[im 24/54]
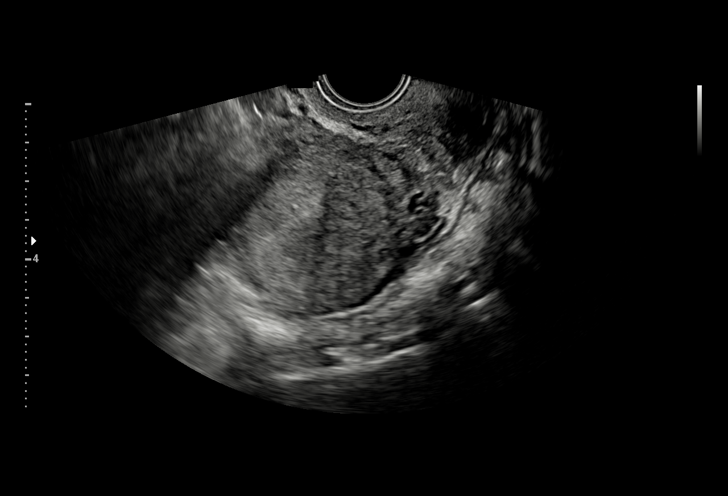
[im 28/54]
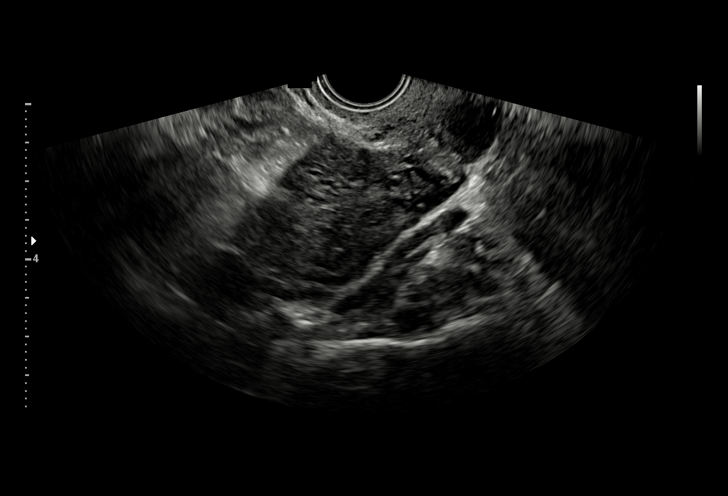
[im 30/54]
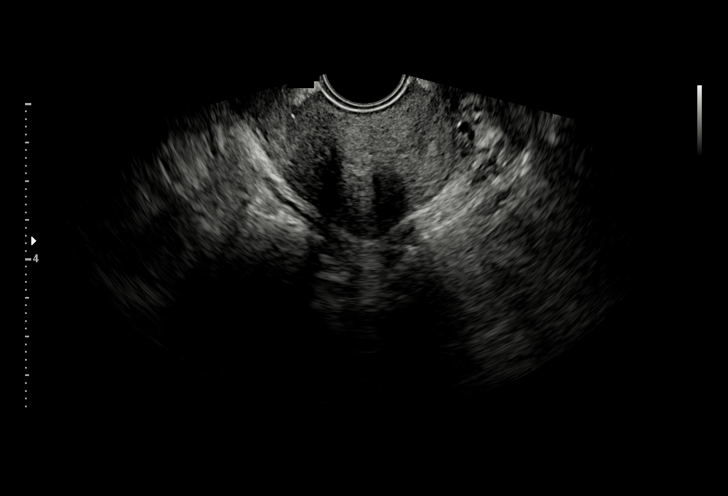
[im 34/54]
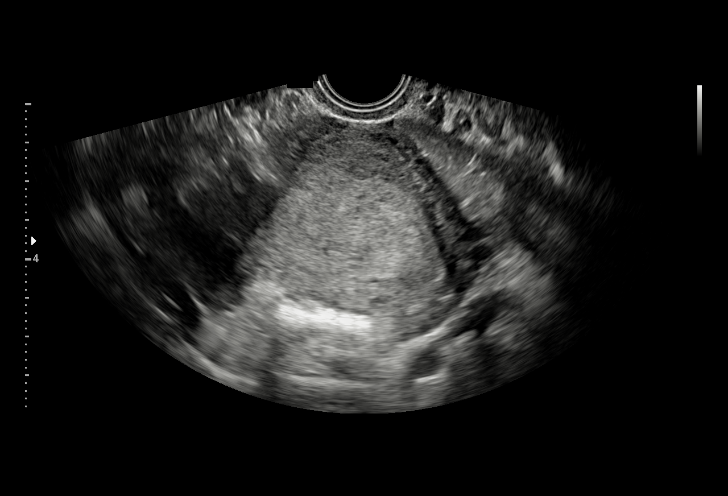
[im 38/54]
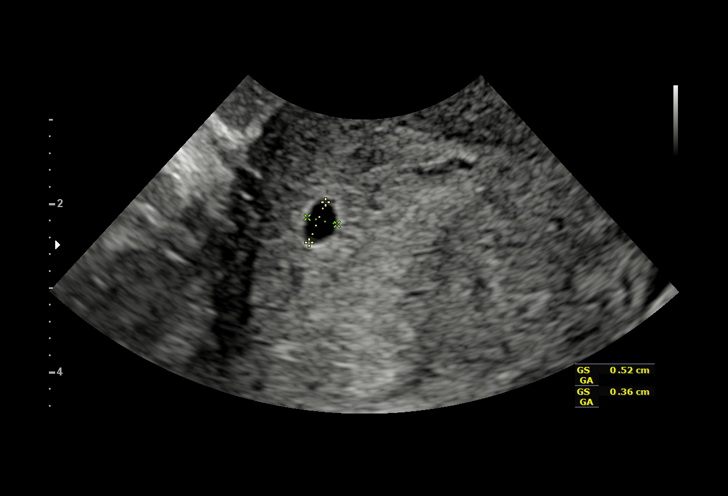
[im 42/54]
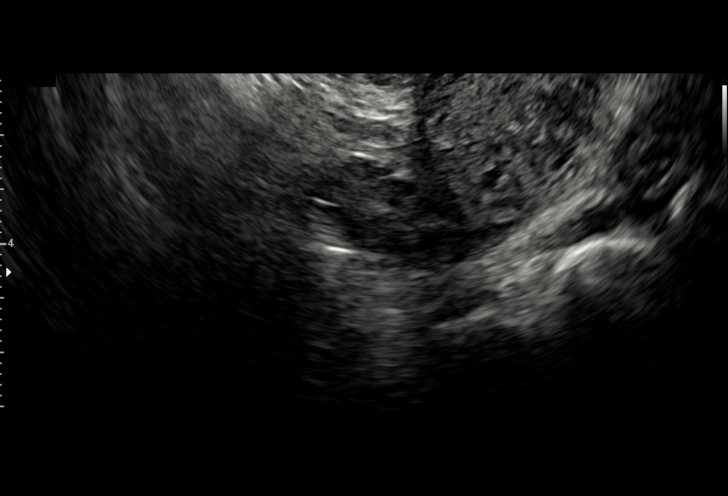
[im 46/54]
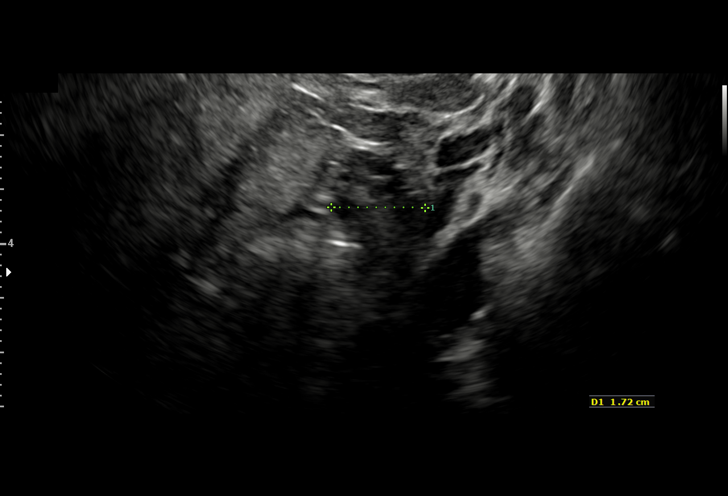
[im 50/54]
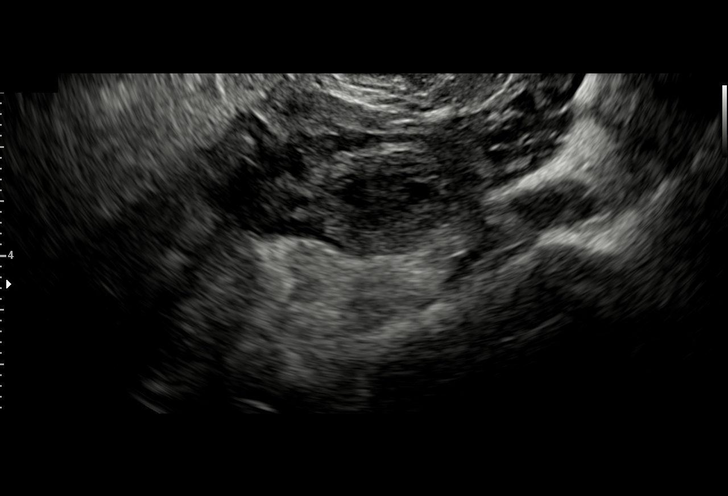
[im 54/54]
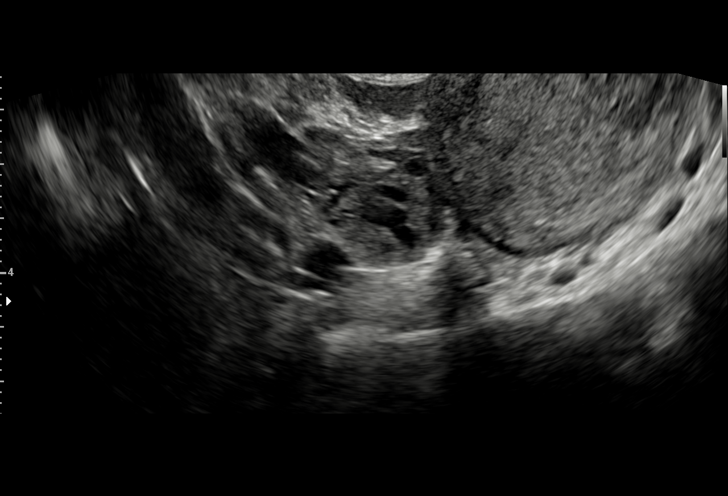

[15 of 28 positions shown; findings below may reference images not displayed]

FINDINGS: Intrauterine gestational sac: Single visualized.

Yolk sac:  Not visualized.

Embryo:  Not visualized.

Cardiac Activity: Not visualized.

Heart Rate: Not visualized.  Bpm

MSD: 4.4  mm   5 w   1  d

Subchorionic hemorrhage:  None visualized.

Maternal uterus/adnexae: Ovaries normal in size, shape and position.
No free pelvic fluid.
IMPRESSION: Intrauterine gestational sac without yolk sac or embryo. Mean sac
diameter compatible with estimated gestational age 5 weeks 1 day.
Findings likely represents a normal early pregnancy. Recommend
follow-up serial quantitative beta HCG and follow-up ultrasound 2
weeks.

## 2019-02-25 ENCOUNTER — Other Ambulatory Visit: Payer: Self-pay

## 2019-02-25 ENCOUNTER — Encounter (HOSPITAL_COMMUNITY): Payer: Self-pay | Admitting: Emergency Medicine

## 2019-02-25 ENCOUNTER — Emergency Department (HOSPITAL_COMMUNITY)
Admission: EM | Admit: 2019-02-25 | Discharge: 2019-02-25 | Disposition: A | Discharge status: Home / Self Care | Payer: Medicaid Other | Attending: Emergency Medicine | Admitting: Emergency Medicine

## 2019-02-25 DIAGNOSIS — J029 Acute pharyngitis, unspecified: Secondary | ICD-10-CM | POA: Diagnosis not present

## 2019-02-25 DIAGNOSIS — F17200 Nicotine dependence, unspecified, uncomplicated: Secondary | ICD-10-CM | POA: Insufficient documentation

## 2019-02-25 DIAGNOSIS — F17220 Nicotine dependence, chewing tobacco, uncomplicated: Secondary | ICD-10-CM | POA: Diagnosis not present

## 2019-02-25 LAB — GROUP A STREP BY PCR: Group A Strep by PCR: NOT DETECTED

## 2019-02-25 MED ORDER — DEXAMETHASONE SODIUM PHOSPHATE 10 MG/ML IJ SOLN
10.0000 mg | Freq: Once | INTRAMUSCULAR | Status: AC
  Administered 2019-02-25: 18:00:00 10 mg via INTRAMUSCULAR
  Filled 2019-02-25: qty 1

## 2019-02-25 MED ORDER — PENICILLIN G BENZATHINE 1200000 UNIT/2ML IM SUSP
1.2000 10*6.[IU] | Freq: Once | INTRAMUSCULAR | Status: AC
  Administered 2019-02-25: 18:00:00 1.2 10*6.[IU] via INTRAMUSCULAR
  Filled 2019-02-25: qty 2

## 2019-02-25 NOTE — ED Provider Notes (Signed)
[facility]-EMERGENCY DEPT Provider Note   CSN: [id number] Arrival date & time: 02/25/19  1608  History   Chief Complaint Sore throat  HPI Jennifer Prince is a 23 y.o. female with no significant past medical history who presents for evaluation of sore throat.  Patient states she developed sore throat yesterday evening.  Throat pain located to bilateral sides.  She rates her pain a 9/10.  Pain does not radiate.  Has been able to tolerate p.o. liquids, however states it hurts to swallow.  No drooling, dysphasia or trismus. Denies oral lesions or lacerations.  Has not taken anything for pain.  She describes her pain as throbbing and aching.  She denies prior history of strep pharyngitis.  No fever, chills, nausea, vomiting, neck pain, neck stiffness, congestion, rhinorrhea, cough, hemoptysis, hematochezia, chest pain, shortness of breath, abdominal pain.  Denies chance of pregnancy.  Denies facial swelling or erythema.  Denies additional aggravating or alleviating factors.  No known sick contacts or known exposure to COVID 19.  History obtained from patient.  No interpreter was used.     HPI  Past Medical History:  Diagnosis Date  . Medical history non-contributory     Patient Active Problem List   Diagnosis Date Noted  . NSVD (normal spontaneous vaginal delivery) 02/02/2018  . Preterm premature rupture of membranes (PPROM) with unknown onset of labor 01/31/2018  . Indication for care in labor or delivery 01/30/2018  . Supervision of other normal pregnancy, antepartum 10/05/2017    Past Surgical History:  Procedure Laterality Date  . NO PAST SURGERIES       OB History    Gravida  2   Para  1   Term  1   Preterm  0   AB  0   Living  1     SAB  0   TAB  0   Ectopic  0   Multiple  0   Live Births  1            Home Medications    Prior to Admission medications   Medication Sig Start Date End Date Taking? Authorizing Provider  ibuprofen  (ADVIL,MOTRIN) 600 MG tablet Take 1 tablet (600 mg total) by mouth every 6 (six) hours. 02/02/18   Myrene Buddy, MD    Family History No family history on file.  Social History Social History   Tobacco Use  . Smoking status: Current Some Day Smoker  . Smokeless tobacco: Current User  Substance Use Topics  . Alcohol use: No  . Drug use: No     Allergies   Patient has no known allergies.   Review of Systems Review of Systems  Constitutional: Negative.   HENT: Positive for sore throat. Negative for congestion, dental problem, drooling, ear discharge, ear pain, facial swelling, postnasal drip, rhinorrhea, sinus pressure, sinus pain, sneezing, tinnitus, trouble swallowing and voice change.   Eyes: Negative for photophobia, discharge, redness and itching.  Respiratory: Negative.   Cardiovascular: Negative.   Gastrointestinal: Negative.   Genitourinary: Negative.   Musculoskeletal: Negative.   Skin: Negative.   Neurological: Negative.   All other systems reviewed and are negative.    Physical Exam Updated Vital Signs BP 127/73 (BP Location: Right Arm)   Pulse 83   Temp 99.4 F (37.4 C) (Oral)   Resp 18   LMP 02/21/2019   SpO2 100%   Physical Exam Vitals signs and nursing note reviewed.  Constitutional:      General:  She is not in acute distress.    Appearance: She is not ill-appearing, toxic-appearing or diaphoretic.     Comments: Talking on phone on initial evaluation.  No acute distress noted.  HENT:     Head: Normocephalic and atraumatic.     Jaw: There is normal jaw occlusion.     Salivary Glands: Right salivary gland is not diffusely enlarged or tender. Left salivary gland is not diffusely enlarged or tender.     Right Ear: Tympanic membrane, ear canal and external ear normal. There is no impacted cerumen. No hemotympanum. Tympanic membrane is not injected, scarred, perforated, erythematous, retracted or bulging.     Left Ear: Tympanic membrane, ear canal and  external ear normal. There is no impacted cerumen. No hemotympanum. Tympanic membrane is not injected, scarred, perforated, erythematous, retracted or bulging.     Ears:     Comments: No Mastoid tenderness.    Nose: Nose normal.     Right Sinus: No maxillary sinus tenderness or frontal sinus tenderness.     Left Sinus: No maxillary sinus tenderness or frontal sinus tenderness.     Comments: No rhinorrhea and congestion to bilateral nares.  No sinus tenderness.    Mouth/Throat:     Lips: Pink.     Mouth: Mucous membranes are moist.     Tongue: No lesions.     Pharynx: Oropharynx is clear. Uvula midline. No pharyngeal swelling, oropharyngeal exudate, posterior oropharyngeal erythema or uvula swelling.     Tonsils: Tonsillar exudate present. No tonsillar abscesses. 2+ on the right. 2+ on the left.     Comments: Posterior oropharynx clear.  Mucous membranes moist.  Tonsils with erythema and exudate bilateral tonsils.  Tonsils 2+ bilaterally.  They do not touch at midline.  Uvula midline without deviation.  No evidence of PTA or RPA.  No drooling, dysphasia or trismus.  Phonation normal.  No pooling of secretions.  Sublingual area soft.  No facial swelling. Neck:     Musculoskeletal: Full passive range of motion without pain and normal range of motion.     Trachea: Trachea and phonation normal.     Comments: No Neck stiffness or neck rigidity.  No meningismus.  Mild anterior cervical lymphadenopathy. Cardiovascular:     Pulses: Normal pulses.     Heart sounds: Normal heart sounds.     Comments: No murmurs rubs or gallops. Pulmonary:     Effort: Pulmonary effort is normal. No tachypnea, accessory muscle usage, respiratory distress or retractions.     Breath sounds: Normal breath sounds and air entry. No decreased breath sounds.     Comments: Clear to auscultation bilaterally without wheeze, rhonchi or rales.  No accessory muscle usage.  Able speak in full sentences. Abdominal:     General: Bowel  sounds are normal.     Palpations: Abdomen is soft.     Tenderness: There is no abdominal tenderness.     Comments: Soft, nontender without rebound or guarding.  No CVA tenderness.  Musculoskeletal:     Comments: Moves all 4 extremities without difficulty.  Lower extremities without edema, erythema or warmth.  Lymphadenopathy:     Cervical: Cervical adenopathy present.  Skin:    Comments: Brisk capillary refill.  No rashes or lesions.  Neurological:     Mental Status: She is alert.     Comments: Ambulatory in department without difficulty.  Cranial nerves II through XII grossly intact.  No facial droop.  No aphasia.      ED  Treatments / Results  Labs (all labs ordered are listed, but only abnormal results are displayed) Labs Reviewed  GROUP A STREP BY PCR    EKG None  Radiology No results found.  Procedures Procedures (including critical care time)  Medications Ordered in ED Medications  dexamethasone (DECADRON) injection 10 mg (10 mg Intramuscular Given 02/25/19 1817)  penicillin g benzathine (BICILLIN LA) 1200000 UNIT/2ML injection 1.2 Million Units (1.2 Million Units Intramuscular Given 02/25/19 1819)     Initial Impression / Assessment and Plan / ED Course  I have reviewed the triage vital signs and the nursing notes.  Pertinent labs & imaging results that were available during my care of the patient were reviewed by me and considered in my medical decision making (see chart for details).  23 year old female appears otherwise well presents for evaluation of sore throat.  Afebrile, nonseptic, non-ill-appearing.  No sick contacts or known COVID-19 exposures.  She has not had any fever, cough, rhinorrhea, congestion, nausea or vomiting.  She does have 2+ bilateral tonsillar edema with exudate.  Tonsils do not touch at midline.  Her uvula is midline without deviation.  She has no evidence of PTA or RPA.  She has no drooling, dysphasia or trismus.  Sublingual area soft.  She  has no pooling of secretions.  She has no facial swelling or evidence of facial cellulitis.  Her neck is without stiffness or rigidity.  She has no meningismus. Low suspicion for meningitis. Tolerating p.o. intake at home, however does have pain.  She does not appear clinically dehydrated.  Lungs and heart clear. Mucous membranes are moist. Abd soft non tender without rebound or guarding. No tenderness over spleen, low suspicion for Mono at this time. Will obtain strep, urine pregnant reevaluate.  Urine preg negative.  Strep negative however nursing states they weren't able to get a "good swab" as patient was gagging. Clinically with tonsillar exudates, cervical lymphadenopathy with absent cough she appear to have strep pharyngitis. Will treat as such. No PTA or RPA. Continues to be hemodynamically stable. Patient given IM penicillin, steroids. Patient able to tolerate p.o. intake in ED without difficulty. She does not have fever, rhinorrhea, cough, SOB or know COVID exposures. Do not think patient needs COVID testing at this time as I have low suspicion for this at this time.  Patient is hemodynamically stable and in no acute distress.  Patient able to ambulate in department prior to ED.  Evaluation does not show acute pathology that would require ongoing or additional emergent interventions while in the emergency department or further inpatient treatment.  I have discussed the diagnosis with the patient and answered all questions.  Pain is been managed while in the emergency department and patient has no further complaints prior to discharge.  Patient is comfortable with plan discussed in room and is stable for discharge at this time.  I have discussed strict return precautions for returning to the emergency department.  Patient was encouraged to follow-up with PCP/specialist refer to at discharge.  DDX: Strep Pharyngitis, PTA, RPA, meningitis, Ludwigs angina, viral pharyngitis, bacterial infection,  Tonsilitis, COVID, UR infection, Mononucleosis, pneumonia,      Jennifer Prince was evaluated in Emergency Department on 02/25/2019 for the symptoms described in the history of present illness. She was evaluated in the context of the global COVID-19 pandemic, which necessitated consideration that the patient might be at risk for infection with the SARS-CoV-2 virus that causes COVID-19. Institutional protocols and algorithms that pertain to the evaluation of  patients at risk for COVID-19 are in a state of rapid change based on information released by regulatory bodies including the CDC and federal and state organizations. These policies and algorithms were followed during the patient's care in the ED. Final Clinical Impressions(s) / ED Diagnoses   Final diagnoses:  Sore throat    ED Discharge Orders    None       Vaniyah Lansky A, PA-C 02/25/19 1845    Little, Ambrose Finland, MD 02/25/19 1916

## 2019-02-25 NOTE — ED Notes (Signed)
Bed: WTR5 Expected date:  Expected time:  Means of arrival:  Comments: 

## 2019-02-25 NOTE — Discharge Instructions (Signed)
Evaluated today for sore throat. Given antibiotics and pain medication. Follow up with PCP on Monday if you continue to have symptoms.

## 2019-02-25 NOTE — ED Triage Notes (Signed)
Per states sore throat and chills for a couple of days-states she has not been taken anything for her symptoms-hasn't been around anyone who's positive for Covid although has been out in public with no mask/protection

## 2019-03-16 IMAGING — US US MFM OB FOLLOW-UP
1 series · 14 of 28 positions shown · non-contrast
Comparison: none

[Series 1: us mfm ob follow-up · 48 acquisitions, 14 frames shown]
[im 2/48]
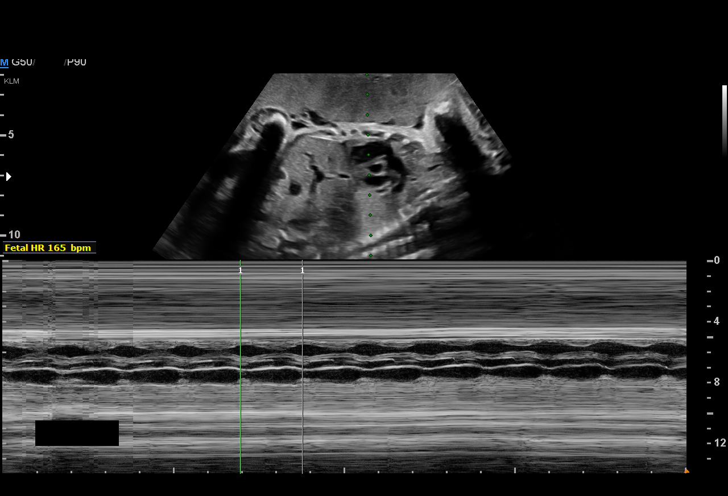
[im 6/48]
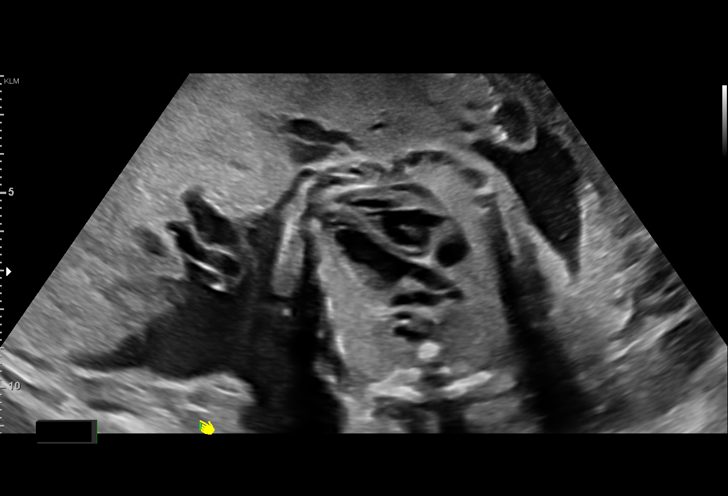
[im 9/48]
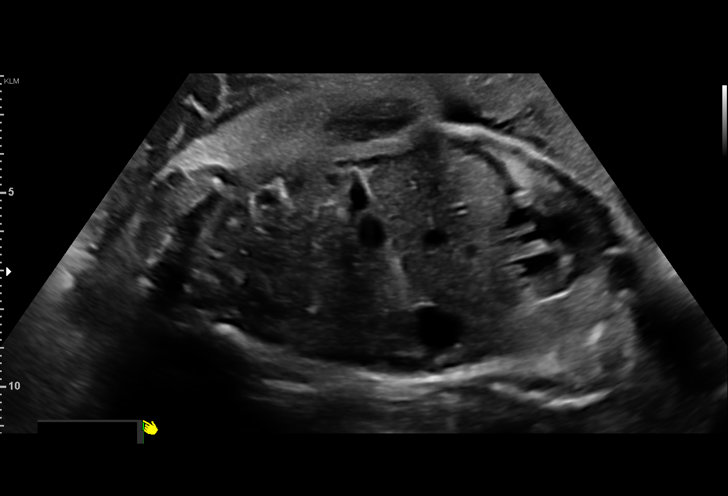
[im 13/48]
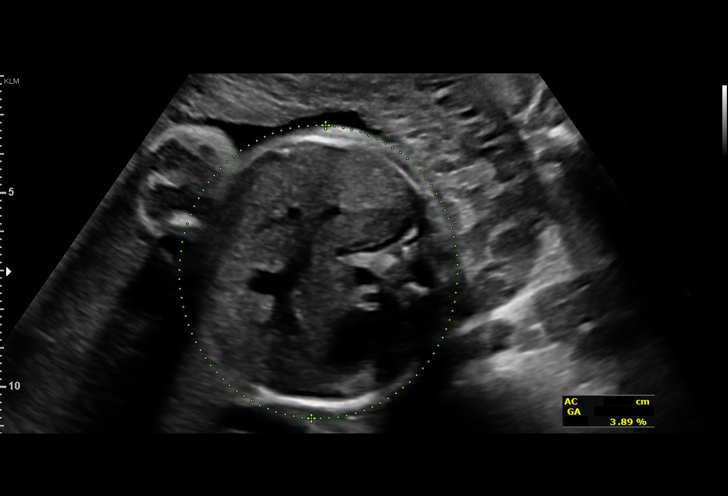
[im 16/48]
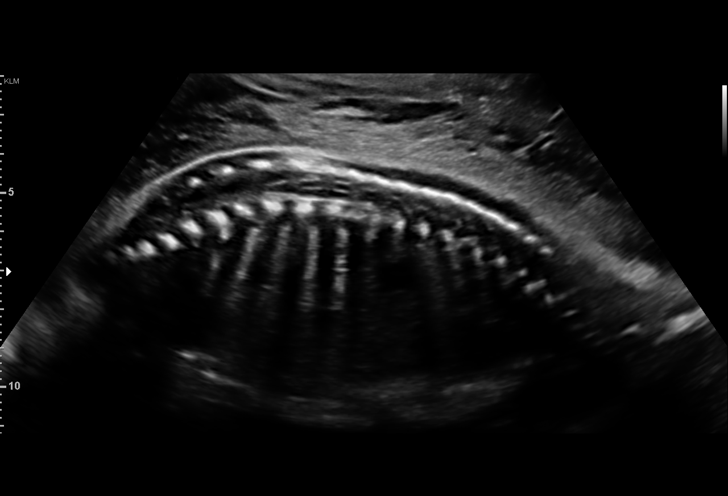
[im 20/48]
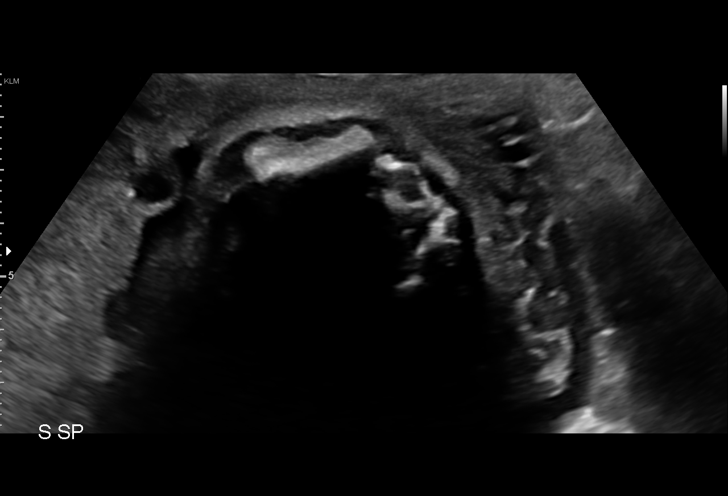
[im 23/48]
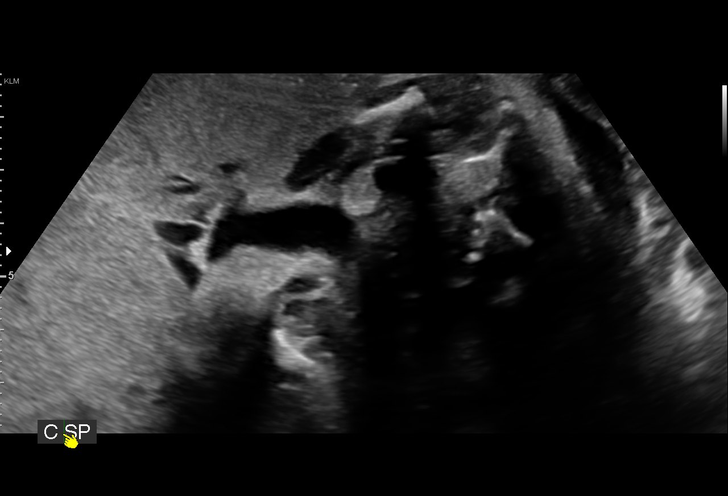
[im 27/48]
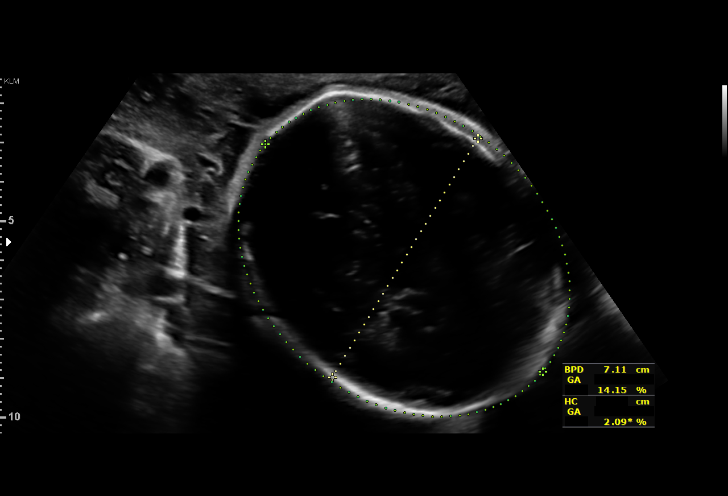
[im 30/48]
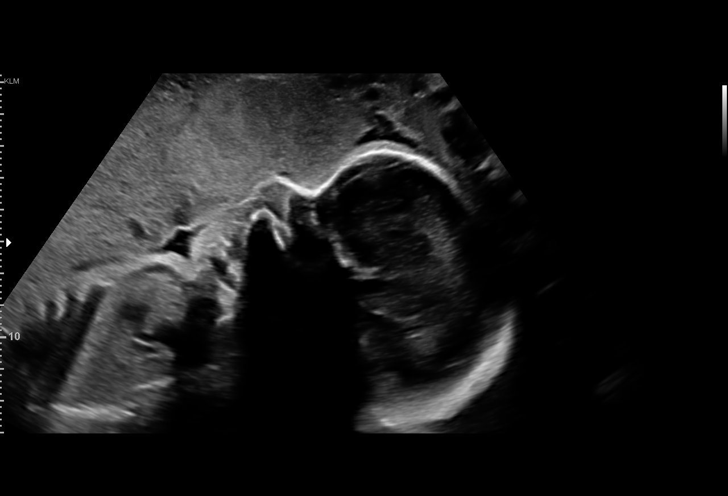
[im 34/48]
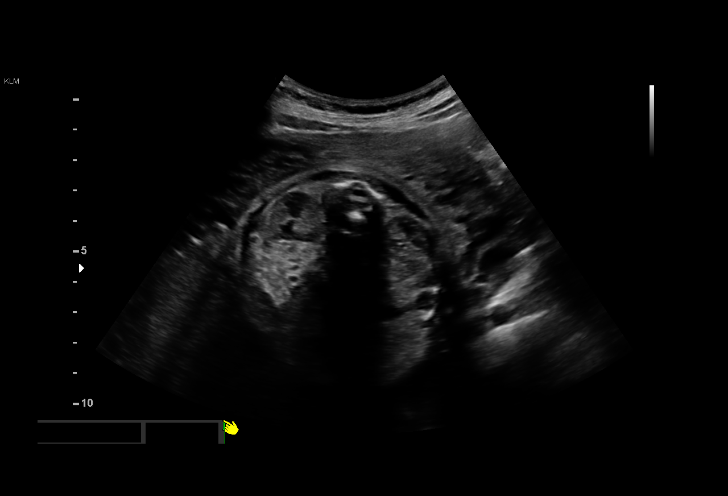
[im 37/48]
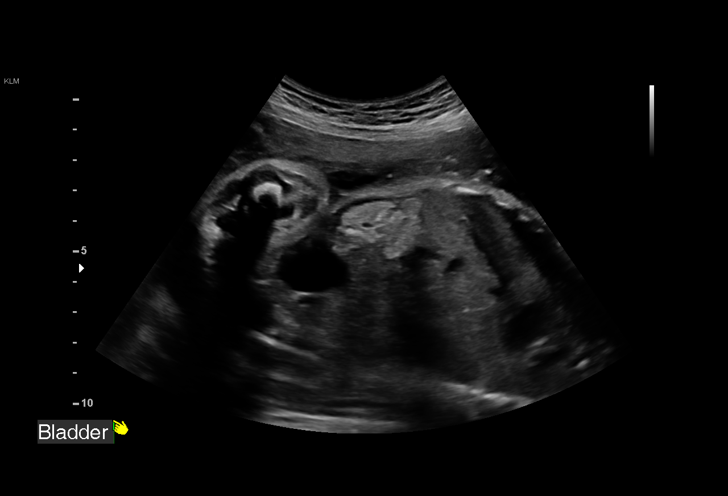
[im 41/48]
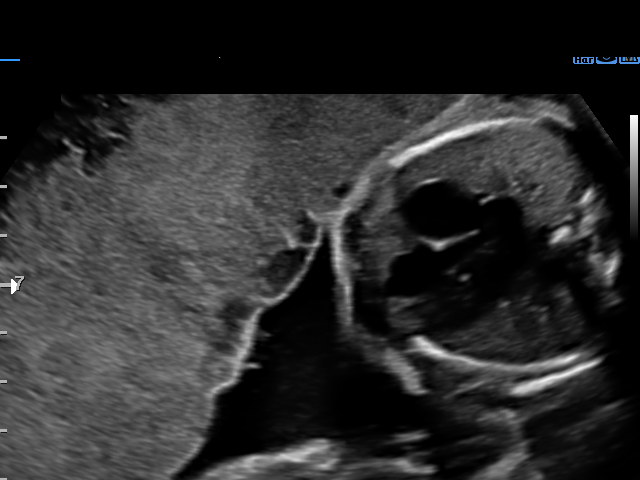
[im 44/48]
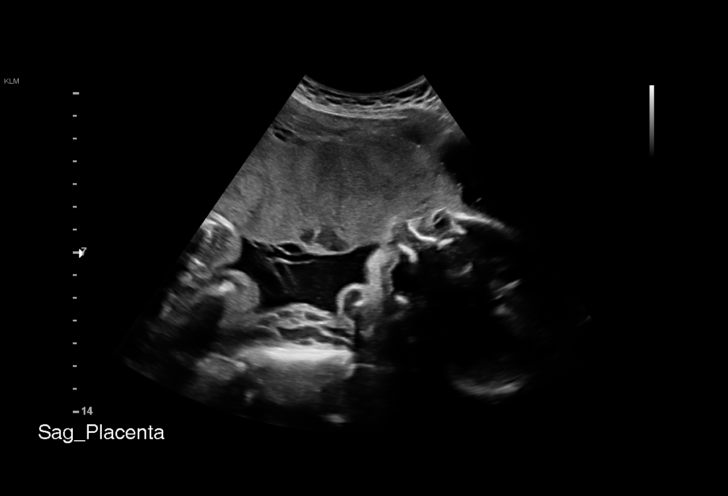
[im 48/48]
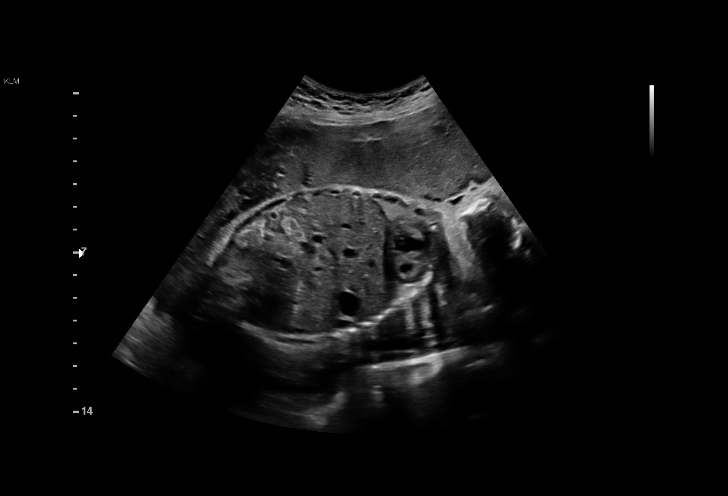

[14 of 28 positions shown; findings below may reference images not displayed]

[REDACTED]. [HOSPITAL],

1  DLEKE TIGER           [id number]      [id number]     [id number]
Indications

29 weeks gestation of pregnancy
Antenatal follow-up for nonvisualized fetal
anatomy
OB History

Blood Type:            Height:  6'0"   Weight (lb):  140       BMI:
Gravidity:    2         Term:   1        Prem:   0        SAB:   0
TOP:          0       Ectopic:  0        Living: 1
Fetal Evaluation

Num Of Fetuses:     1
Fetal Heart         165
Rate(bpm):
Cardiac Activity:   Observed
Presentation:       Cephalic
Placenta:           Anterior, above cervical os
P. Cord Insertion:  Visualized

Amniotic Fluid
AFI FV:      Subjectively within normal limits
Biometry

BPD:      70.3  mm     G. Age:  28w 2d          9  %    CI:        73.03   %    70 - 86
FL/HC:      21.0   %    19.6 -
HC:      261.5  mm     G. Age:  28w 3d          4  %    HC/AC:      1.09        0.99 -
AC:      239.8  mm     G. Age:  28w 2d         15  %    FL/BPD:     78.2   %    71 - 87
FL:         55  mm     G. Age:  29w 0d         24  %    FL/AC:      22.9   %    20 - 24

Est. FW:    9689  gm    2 lb 12 oz      34  %
Gestational Age

LMP:           29w 3d        Date:  05/16/17                 EDD:   02/20/18
U/S Today:     28w 4d                                        EDD:   02/26/18
Best:          29w 3d     Det. By:  LMP  (05/16/17)          EDD:   02/20/18
Anatomy

Cranium:               Appears normal         Aortic Arch:            Appears normal
Cavum:                 Previously seen        Ductal Arch:            Appears normal
Ventricles:            Previously seen        Diaphragm:              Appears normal
Choroid Plexus:        Previously seen        Stomach:                Appears normal, left
sided
Cerebellum:            Previously seen        Abdomen:                Appears normal
Posterior Fossa:       Previously seen        Abdominal Wall:         Appears nml (cord
insert, abd wall)
Nuchal Fold:           Previously seen        Cord Vessels:           Previously seen
Face:                  Appears normal         Kidneys:                Appear normal
(orbits and profile)
Lips:                  Previously seen        Bladder:                Appears normal
Thoracic:              Appears normal         Spine:                  Appears normal
Heart:                 Appears normal         Upper Extremities:      Previously seen
(4CH, axis, and situs
RVOT:                  Appears normal         Lower Extremities:      Previously seen
LVOT:                  Appears normal

Other:  Fetus appears to be a female. Heels and left 5th digit previously
visualized.
Cervix Uterus Adnexa

Cervix
Not visualized (advanced GA >35wks)
Impression

SIUP at 29+3 weeks with cardiac activity
Normal interval anatomy; anatomic survey complete
Normal amniotic fluid volume
Appropriate interval growth with EFW at the 34th %tile
Recommendations

Follow-up as clinically indicated

## 2019-07-06 ENCOUNTER — Encounter (HOSPITAL_COMMUNITY): Payer: Self-pay | Admitting: Emergency Medicine

## 2019-07-06 ENCOUNTER — Other Ambulatory Visit: Payer: Self-pay

## 2019-07-06 ENCOUNTER — Emergency Department (HOSPITAL_COMMUNITY)
Admission: EM | Admit: 2019-07-06 | Discharge: 2019-07-06 | Disposition: A | Discharge status: Home / Self Care | Payer: Medicaid Other | Attending: Emergency Medicine | Admitting: Emergency Medicine

## 2019-07-06 DIAGNOSIS — R252 Cramp and spasm: Secondary | ICD-10-CM | POA: Diagnosis not present

## 2019-07-06 DIAGNOSIS — F17200 Nicotine dependence, unspecified, uncomplicated: Secondary | ICD-10-CM | POA: Insufficient documentation

## 2019-07-06 DIAGNOSIS — M542 Cervicalgia: Secondary | ICD-10-CM | POA: Diagnosis present

## 2019-07-06 DIAGNOSIS — M62838 Other muscle spasm: Secondary | ICD-10-CM

## 2019-07-06 DIAGNOSIS — Z79899 Other long term (current) drug therapy: Secondary | ICD-10-CM | POA: Diagnosis not present

## 2019-07-06 MED ORDER — METHOCARBAMOL 500 MG PO TABS: 500.0000 mg | ORAL_TABLET | Freq: Every evening | ORAL | 0 refills | Status: AC | PRN

## 2019-07-06 MED ORDER — NAPROXEN 500 MG PO TABS: 500.0000 mg | ORAL_TABLET | Freq: Two times a day (BID) | ORAL | 0 refills | Status: AC

## 2019-07-06 NOTE — ED Provider Notes (Signed)
[facility] DEPT Provider Note   CSN: [id number] Arrival date & time: 07/06/19  1107     History   Chief Complaint Chief Complaint  Patient presents with  . Neck Pain    HPI Jennifer Prince is a 23 y.o. female.     HPI   Patient is a 23 year old female who presents the emergency department today complaining of right-sided neck pain that began when she woke up this morning.  She denies any recent falls or trauma.  She states the is 10/10.  She tried Tylenol today without relief.  She thinks that the pain is caused from her work at [employer].  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  She is also right-handed and states she does a lot of repetitive motions with her hands during work.  She denies any numbness or weakness to the arms or any other associated symptoms.  She is requesting work note.  Past Medical History:  Diagnosis Date  . Medical history non-contributory     Patient Active Problem List   Diagnosis Date Noted  . NSVD (normal spontaneous vaginal delivery) 02/02/2018  . Preterm premature rupture of membranes (PPROM) with unknown onset of labor 01/31/2018  . Indication for care in labor or delivery 01/30/2018  . Supervision of other normal pregnancy, antepartum 10/05/2017    Past Surgical History:  Procedure Laterality Date  . NO PAST SURGERIES       OB History    Gravida  2   Para  1   Term  1   Preterm  0   AB  0   Living  1     SAB  0   TAB  0   Ectopic  0   Multiple  0   Live Births  1            Home Medications    Prior to Admission medications   Medication Sig Start Date End Date Taking? Authorizing Provider  ibuprofen (ADVIL,MOTRIN) 600 MG tablet Take 1 tablet (600 mg total) by mouth every 6 (six) hours. 02/02/18   Guadalupe Dawn, MD  methocarbamol (ROBAXIN) 500 MG tablet Take 1 tablet (500 mg total) by mouth at bedtime as  needed for up to 5 doses for muscle spasms. 07/06/19   Chara Marquard S, PA-C  naproxen (NAPROSYN) 500 MG tablet Take 1 tablet (500 mg total) by mouth 2 (two) times daily for 5 days. 07/06/19 07/11/19  Cotey Rakes S, PA-C    Family History No family history on file.  Social History Social History   Tobacco Use  . Smoking status: Current Some Day Smoker  . Smokeless tobacco: Current User  Substance Use Topics  . Alcohol use: No  . Drug use: Yes    Types: Marijuana     Allergies   Patient has no known allergies.   Review of Systems Review of Systems  Musculoskeletal: Positive for neck pain.  Neurological: Negative for weakness and numbness.     Physical Exam Updated Vital Signs BP 116/70   Pulse 87   Temp 98.8 F (37.1 C) (Oral)   Resp 17   LMP 06/18/2019   SpO2 98%   Physical Exam Vitals signs and nursing note reviewed.  Constitutional:      General: She is not in acute distress.    Appearance: She is well-developed.  HENT:  Head: Normocephalic and atraumatic.  Eyes:     Conjunctiva/sclera: Conjunctivae normal.  Neck:     Musculoskeletal: Neck supple.  Cardiovascular:     Rate and Rhythm: Normal rate.  Pulmonary:     Effort: Pulmonary effort is normal.  Musculoskeletal: Normal range of motion.     Comments: No midline cervical spine tenderness.  TTP to the right cervical paraspinous muscles and right trapezius muscles that reproduces her symptoms.  5/5 strength to the bilateral upper and lower extremities with normal sensation throughout.  Skin:    General: Skin is warm and dry.  Neurological:     Mental Status: She is alert.      ED Treatments / Results  Labs (all labs ordered are listed, but only abnormal results are displayed) Labs Reviewed - No data to display  EKG None  Radiology No results found.  Procedures Procedures (including critical care time)  Medications Ordered in ED Medications - No data to display   Initial  Impression / Assessment and Plan / ED Course  I have reviewed the triage vital signs and the nursing notes.  Pertinent labs & imaging results that were available during my care of the patient were reviewed by me and considered in my medical decision making (see chart for details).   Final Clinical Impressions(s) / ED Diagnoses   Final diagnoses:  Muscle spasm    Patient is a 23 year old female who presents the emergency department today complaining of right-sided neck pain that began when she woke up this morning.  She denies any recent falls or trauma.  She states the is 10/10.  She tried Tylenol today without relief.  She thinks that the pain is caused from her work at [employer].  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  She is also right-handed and states she does a lot of repetitive motions with her hands during work.  She denies any numbness or weakness to the arms or any other associated symptoms.  She is requesting work note.  Initial O2 sat documented 87% however feel this is documented in error as I personally checked the patient's O2 sat was 100% on room air.  On exam, patient with right cervical paraspinous muscle tenderness as well as tenderness over the right trapezius muscle.  Suspect muscle spasm.  Advised Tylenol, anti-inflammatory, muscle relaxers.  Advise follow-up with PCP and return if worse.  ED Discharge Orders         Ordered    naproxen (NAPROSYN) 500 MG tablet  2 times daily     07/06/19 1152    methocarbamol (ROBAXIN) 500 MG tablet  At bedtime PRN     07/06/19 1152           Cache Bills S, PA-C 07/06/19 1218    Raeford Razor, MD 07/06/19 1242

## 2019-07-06 NOTE — Discharge Instructions (Signed)

## 2019-07-06 NOTE — ED Triage Notes (Signed)
Pt reports waking up this morning with neck pains.

## 2020-06-17 ENCOUNTER — Encounter (HOSPITAL_COMMUNITY): Payer: Self-pay | Admitting: Emergency Medicine

## 2020-06-17 ENCOUNTER — Other Ambulatory Visit: Payer: Self-pay

## 2020-06-17 ENCOUNTER — Emergency Department (HOSPITAL_COMMUNITY)
Admission: EM | Admit: 2020-06-17 | Discharge: 2020-06-17 | Disposition: A | Discharge status: Home / Self Care | Payer: Medicaid Other | Attending: Emergency Medicine | Admitting: Emergency Medicine

## 2020-06-17 DIAGNOSIS — R112 Nausea with vomiting, unspecified: Secondary | ICD-10-CM | POA: Diagnosis not present

## 2020-06-17 DIAGNOSIS — R1084 Generalized abdominal pain: Secondary | ICD-10-CM | POA: Diagnosis not present

## 2020-06-17 DIAGNOSIS — Z5321 Procedure and treatment not carried out due to patient leaving prior to being seen by health care provider: Secondary | ICD-10-CM | POA: Insufficient documentation

## 2020-06-17 DIAGNOSIS — R197 Diarrhea, unspecified: Secondary | ICD-10-CM | POA: Diagnosis not present

## 2020-06-17 LAB — COMPREHENSIVE METABOLIC PANEL
ALT: 12 U/L (ref 0–44)
AST: 21 U/L (ref 15–41)
Albumin: 4.1 g/dL (ref 3.5–5.0)
Alkaline Phosphatase: 66 U/L (ref 38–126)
Anion gap: 14 (ref 5–15)
BUN: 7 mg/dL (ref 6–20)
CO2: 21 mmol/L — ABNORMAL LOW (ref 22–32)
Calcium: 9.6 mg/dL (ref 8.9–10.3)
Chloride: 105 mmol/L (ref 98–111)
Creatinine, Ser: 0.75 mg/dL (ref 0.44–1.00)
GFR calc Af Amer: >60 mL/min (ref >60)
GFR calc non Af Amer: >60 mL/min (ref >60)
Glucose, Bld: 125 mg/dL — ABNORMAL HIGH (ref 70–99)
Potassium: 3.4 mmol/L — ABNORMAL LOW (ref 3.5–5.1)
Sodium: 140 mmol/L (ref 135–145)
Total Bilirubin: 0.9 mg/dL (ref 0.3–1.2)
Total Protein: 7.8 g/dL (ref 6.5–8.1)

## 2020-06-17 LAB — I-STAT BETA HCG BLOOD, ED (MC, WL, AP ONLY): I-stat hCG, quantitative: <5 m[IU]/mL (ref <5)

## 2020-06-17 LAB — CBC
HCT: 40.3 % (ref 36.0–46.0)
Hemoglobin: 13.5 g/dL (ref 12.0–15.0)
MCH: 30.1 pg (ref 26.0–34.0)
MCHC: 33.5 g/dL (ref 30.0–36.0)
MCV: 89.8 fL (ref 80.0–100.0)
Platelets: 267 10*3/uL (ref 150–400)
RBC: 4.49 MIL/uL (ref 3.87–5.11)
RDW: 13.6 % (ref 11.5–15.5)
WBC: 8.7 10*3/uL (ref 4.0–10.5)
nRBC: 0 % (ref 0.0–0.2)

## 2020-06-17 LAB — LIPASE, BLOOD: Lipase: 19 U/L (ref 11–51)

## 2020-06-17 NOTE — ED Notes (Signed)
[phone] Hummer, grandmother would like an update

## 2020-06-17 NOTE — ED Triage Notes (Signed)
Pt reports generalized abd pain, nausea, vomiting, and diarrhea x 2 days.  States she stopped taking Percocet 2 days ago.

## 2020-06-17 NOTE — ED Notes (Signed)
Pt left without being seen.
